# Patient Record
Sex: Male | Born: 1960 | Race: White | Hispanic: No
Health system: Midwestern US, Community
[De-identification: ages and names within clinical notes are randomized; demographics above are authoritative.]

## PROBLEM LIST (undated history)

## (undated) DIAGNOSIS — T8484XA Pain due to internal orthopedic prosthetic devices, implants and grafts, initial encounter: Secondary | ICD-10-CM

## (undated) DIAGNOSIS — T7840XA Allergy, unspecified, initial encounter: Secondary | ICD-10-CM

## (undated) DIAGNOSIS — D239 Other benign neoplasm of skin, unspecified: Secondary | ICD-10-CM

## (undated) DIAGNOSIS — E785 Hyperlipidemia, unspecified: Secondary | ICD-10-CM

## (undated) HISTORY — DX: Hyperlipidemia, unspecified: E78.5

## (undated) HISTORY — PX: OTHER SURGICAL HISTORY: SHX169

## (undated) HISTORY — DX: Other benign neoplasm of skin, unspecified: D23.9

## (undated) HISTORY — DX: Allergy, unspecified, initial encounter: T78.40XA

---

## 2001-05-21 ENCOUNTER — Emergency Department (HOSPITAL_COMMUNITY): Admission: EM | Admit: 2001-05-21 | Discharge: 2001-05-21 | Payer: Self-pay | Admitting: Emergency Medicine

## 2001-05-21 ENCOUNTER — Encounter: Payer: Self-pay | Admitting: Emergency Medicine

## 2004-05-21 ENCOUNTER — Ambulatory Visit (HOSPITAL_COMMUNITY): Admission: RE | Admit: 2004-05-21 | Discharge: 2004-05-21 | Payer: Self-pay | Admitting: Orthopedic Surgery

## 2004-05-21 ENCOUNTER — Ambulatory Visit (HOSPITAL_BASED_OUTPATIENT_CLINIC_OR_DEPARTMENT_OTHER): Admission: RE | Admit: 2004-05-21 | Discharge: 2004-05-21 | Payer: Self-pay | Admitting: Orthopedic Surgery

## 2005-07-03 ENCOUNTER — Ambulatory Visit: Payer: Self-pay | Admitting: Internal Medicine

## 2005-07-23 ENCOUNTER — Ambulatory Visit: Payer: Self-pay | Admitting: Internal Medicine

## 2005-07-31 ENCOUNTER — Ambulatory Visit: Payer: Self-pay | Admitting: Internal Medicine

## 2005-08-07 ENCOUNTER — Ambulatory Visit: Payer: Self-pay | Admitting: Internal Medicine

## 2006-01-04 ENCOUNTER — Ambulatory Visit: Payer: Self-pay | Admitting: Internal Medicine

## 2006-07-26 ENCOUNTER — Ambulatory Visit: Payer: Self-pay | Admitting: Internal Medicine

## 2006-07-26 LAB — CONVERTED CEMR LAB
Cholesterol: 143 mg/dL (ref 0–200)
HDL: 43.1 mg/dL (ref >39.0)
LDL Cholesterol: 90 mg/dL (ref 0–99)
Total CHOL/HDL Ratio: 3.3
Triglycerides: 48 mg/dL (ref 0–149)
VLDL: 10 mg/dL (ref 0–40)

## 2006-08-02 ENCOUNTER — Ambulatory Visit: Payer: Self-pay | Admitting: Internal Medicine

## 2006-09-03 ENCOUNTER — Ambulatory Visit: Payer: Self-pay | Admitting: Internal Medicine

## 2006-09-03 LAB — CONVERTED CEMR LAB: PSA: 0.9 ng/mL (ref 0.10–4.00)

## 2007-05-30 ENCOUNTER — Ambulatory Visit: Payer: Self-pay | Admitting: Internal Medicine

## 2007-05-30 LAB — CONVERTED CEMR LAB
ALT: 21 units/L (ref 0–53)
AST: 20 units/L (ref 0–37)
Albumin: 4.1 g/dL (ref 3.5–5.2)
Alkaline Phosphatase: 61 units/L (ref 39–117)
BUN: 14 mg/dL (ref 6–23)
Basophils Absolute: 0.1 10*3/uL (ref 0.0–0.1)
Basophils Relative: 1.1 % — ABNORMAL HIGH (ref 0.0–1.0)
Bilirubin Urine: NEGATIVE
Bilirubin, Direct: 0.2 mg/dL (ref 0.0–0.3)
Blood in Urine, dipstick: NEGATIVE
CO2: 31 meq/L (ref 19–32)
Calcium: 9.3 mg/dL (ref 8.4–10.5)
Chloride: 104 meq/L (ref 96–112)
Creatinine, Ser: 1.1 mg/dL (ref 0.4–1.5)
Eosinophils Absolute: 0.3 10*3/uL (ref 0.0–0.6)
Eosinophils Relative: 5.6 % — ABNORMAL HIGH (ref 0.0–5.0)
GFR calc Af Amer: 92 mL/min
GFR calc non Af Amer: 76 mL/min
Glucose, Bld: 90 mg/dL (ref 70–99)
Glucose, Urine, Semiquant: NEGATIVE
HCT: 34.7 % — ABNORMAL LOW (ref 39.0–52.0)
Hemoglobin: 12.3 g/dL — ABNORMAL LOW (ref 13.0–17.0)
Ketones, urine, test strip: NEGATIVE
Lymphocytes Relative: 30.2 % (ref 12.0–46.0)
MCHC: 35.5 g/dL (ref 30.0–36.0)
MCV: 90.2 fL (ref 78.0–100.0)
Monocytes Absolute: 0.4 10*3/uL (ref 0.2–0.7)
Monocytes Relative: 8.1 % (ref 3.0–11.0)
Neutro Abs: 2.7 10*3/uL (ref 1.4–7.7)
Neutrophils Relative %: 55 % (ref 43.0–77.0)
Nitrite: NEGATIVE
Platelets: 209 10*3/uL (ref 150–400)
Potassium: 4.4 meq/L (ref 3.5–5.1)
RBC: 3.85 M/uL — ABNORMAL LOW (ref 4.22–5.81)
RDW: 11.7 % (ref 11.5–14.6)
Sodium: 140 meq/L (ref 135–145)
Specific Gravity, Urine: 1.02
TSH: 1.31 microintl units/mL (ref 0.35–5.50)
Total Bilirubin: 1 mg/dL (ref 0.3–1.2)
Total Protein: 6 g/dL (ref 6.0–8.3)
Urobilinogen, UA: 0.2
WBC Urine, dipstick: NEGATIVE
WBC: 5 10*3/uL (ref 4.5–10.5)
pH: 6

## 2007-06-06 ENCOUNTER — Ambulatory Visit: Payer: Self-pay | Admitting: Internal Medicine

## 2007-06-06 DIAGNOSIS — J309 Allergic rhinitis, unspecified: Secondary | ICD-10-CM | POA: Insufficient documentation

## 2007-10-12 ENCOUNTER — Ambulatory Visit: Payer: Self-pay | Admitting: Internal Medicine

## 2009-01-01 ENCOUNTER — Telehealth: Payer: Self-pay | Admitting: Internal Medicine

## 2009-01-02 ENCOUNTER — Ambulatory Visit: Payer: Self-pay | Admitting: Internal Medicine

## 2009-01-03 ENCOUNTER — Ambulatory Visit: Payer: Self-pay | Admitting: Internal Medicine

## 2010-01-25 IMAGING — CR DG CHEST 2V
3 series · 3 of 3 positions shown · non-contrast
Comparison: None.

CLINICAL DATA: Shortness of breath over the past week for weeks.

CHEST - 2 VIEW 01/02/2009:

[view not recorded (1 of 3)]
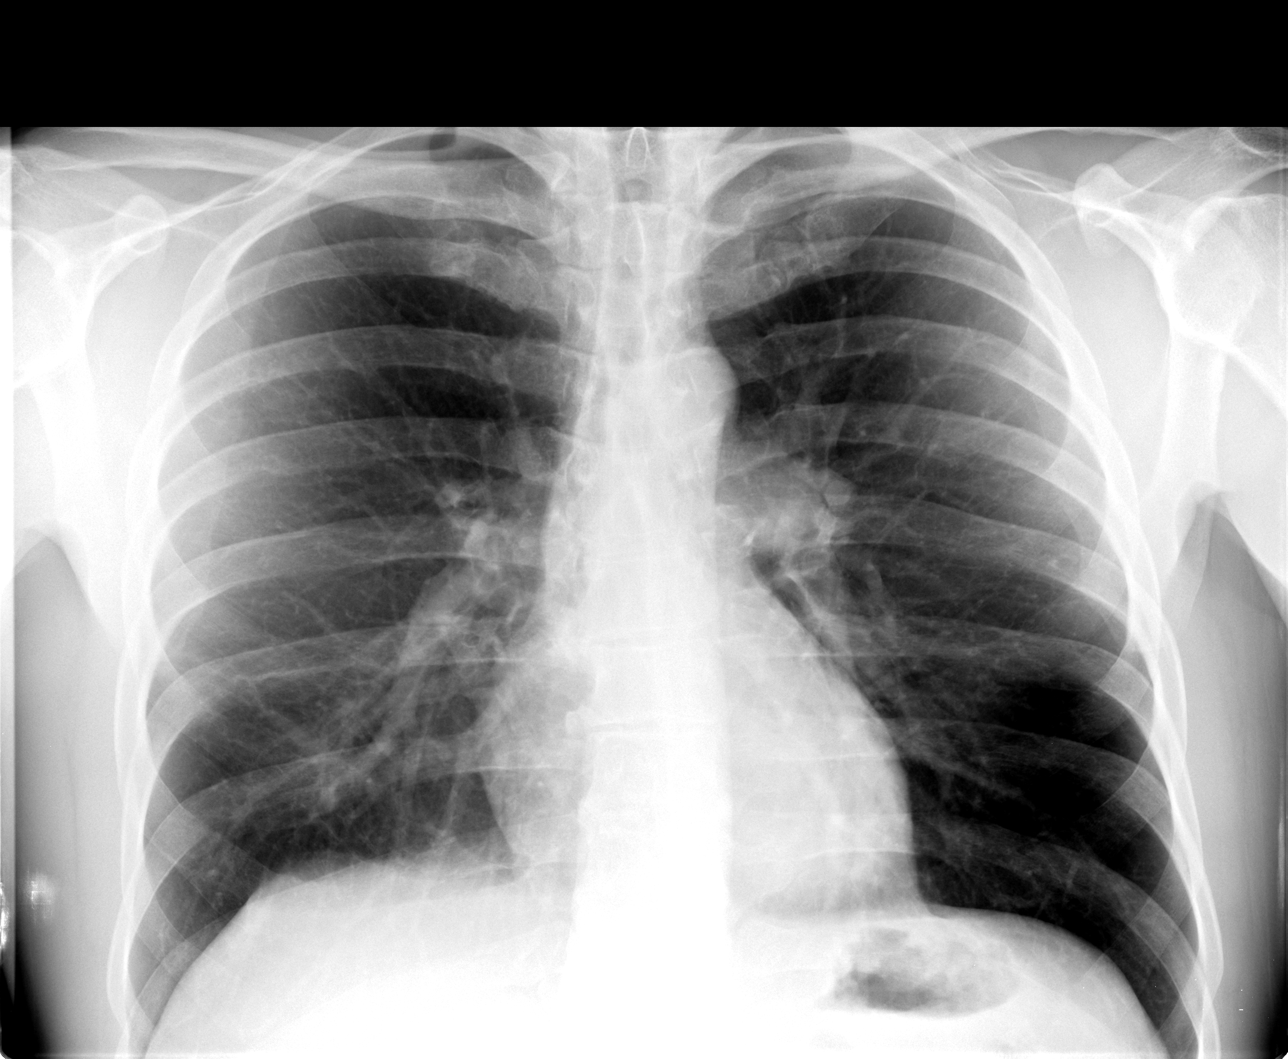

[view not recorded (2 of 3)]
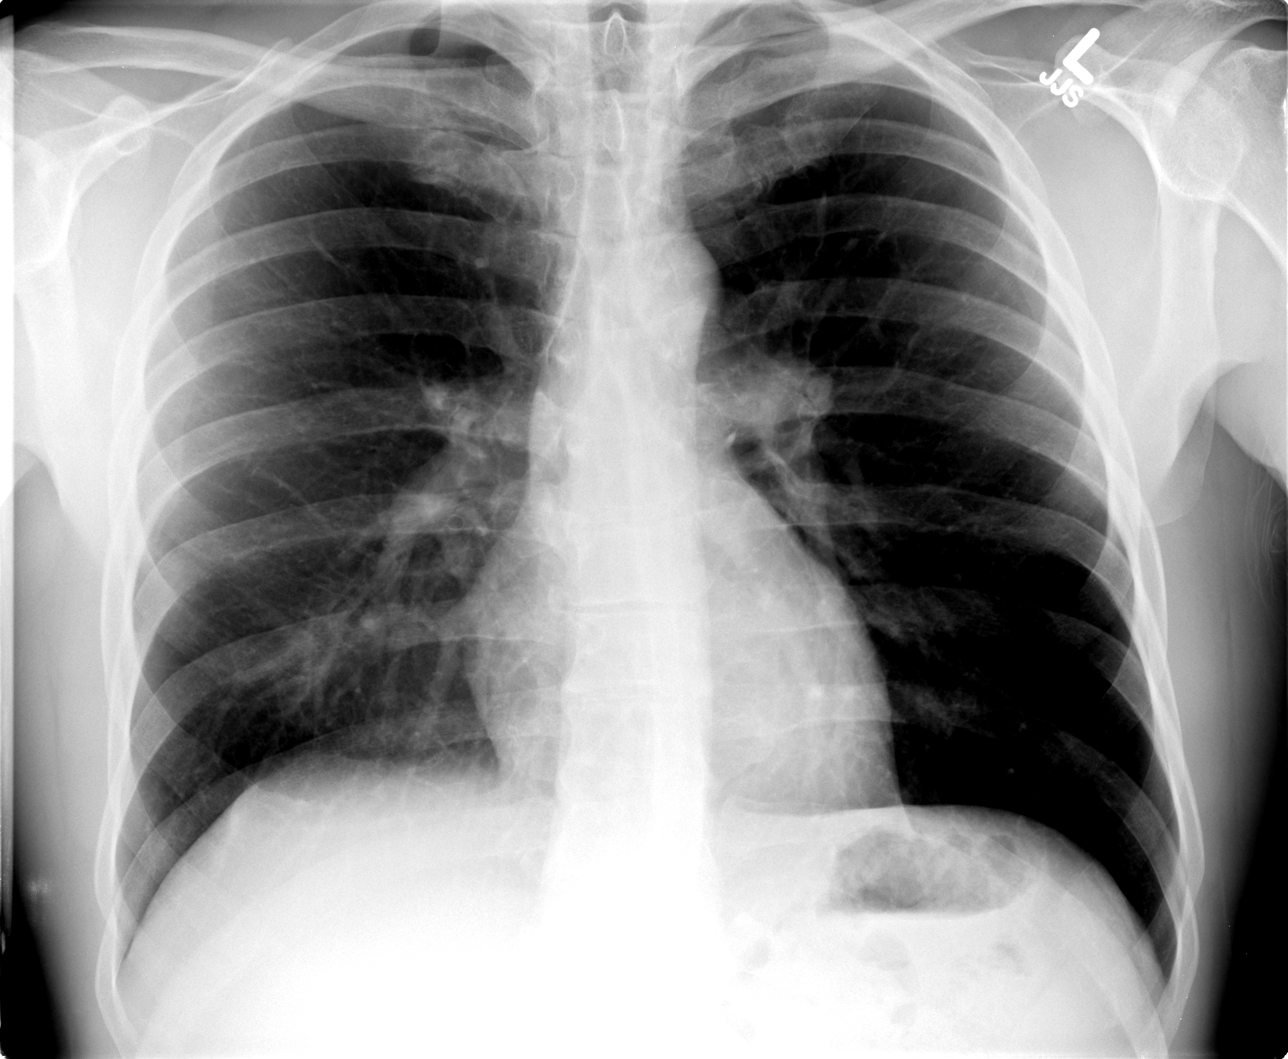

[view not recorded (3 of 3)]
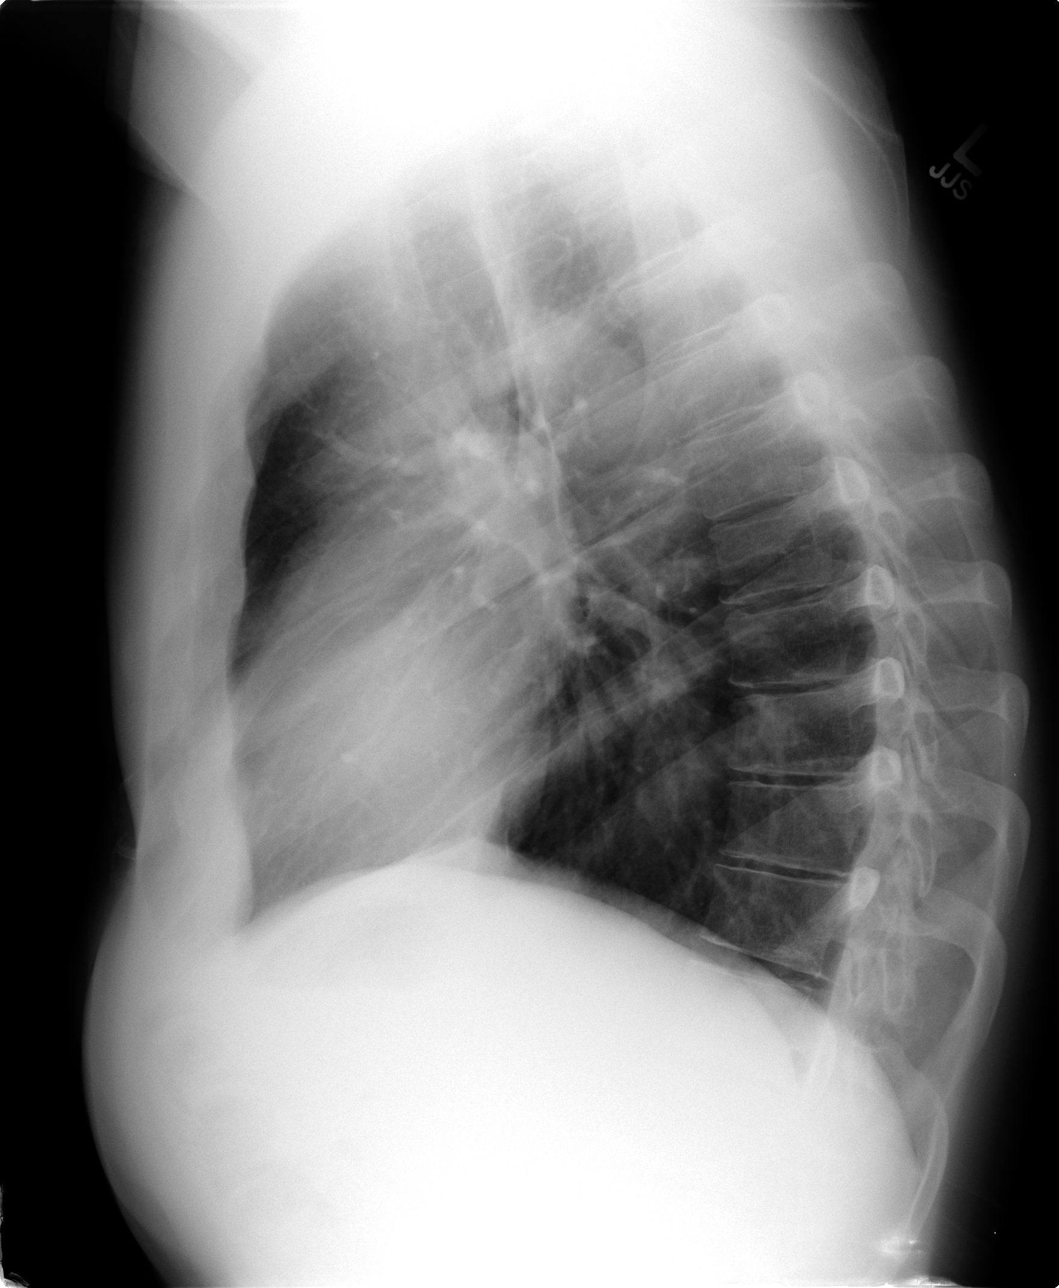

[3 of 3 positions shown; findings below may reference images not displayed]

FINDINGS: Heart size normal.  Normal appearing thoracic aorta.
Prominent left hilum.  Heart right hilum and remaining mediastinal
contours normal.  Asymmetric pulmonary vascularity, with diminished
vascularity in the left lung base relative to the right.  Lungs
clear.  No pleural effusions.  Visualized bony thorax intact.
IMPRESSION: 1.  Prominent left hilum.  While this may be related to an enlarged
pulmonary artery, underlying mass/lymphadenopathy is not entirely
excluded.
2.  Asymmetry in pulmonary vascularity of the lung bases,
diminished on the left. Query mild changes of  Wisam
syndrome.

Because of the above findings, and because of the lack of prior
chest x-rays for comparison, CT chest with contrast would be
suggested in further evaluation.

## 2010-01-26 IMAGING — CT CT CHEST W/ CM
2 of 4 series · 15 of 36 positions shown, 18 images · IV contrast (Omnipaque 300)
Comparison: Chest radiograph 01/02/2009.

CLINICAL DATA: Abnormal chest x-ray 01/02/2009.  Prominent left
hilum.  Shortness of breath.

CT CHEST WITH CONTRAST
TECHNIQUE: Multidetector CT imaging of the chest was performed
following the standard protocol during bolus administration of
intravenous contrast.
Contrast: 80 ml Omnipaque 300

[Series 2: chest routine with · axial · 0.81mm/px · z∈[-351,-76]mm · 12 of 67 slices shown, 15 images]
[im 6/67  mediastinal]
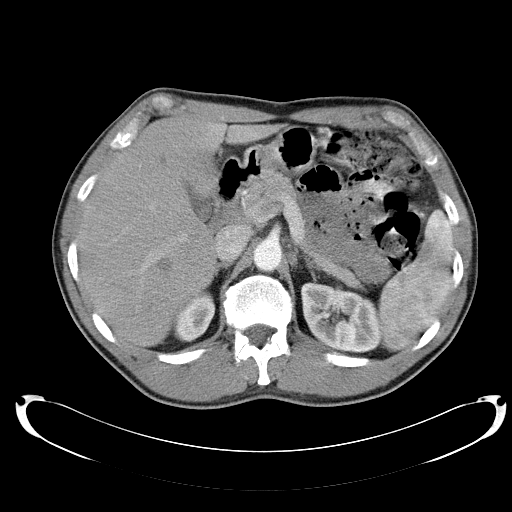
[im 6/67  lung]
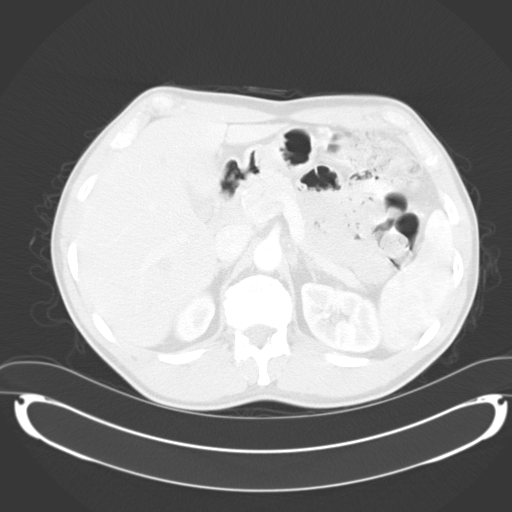
[im 11/67  lung]
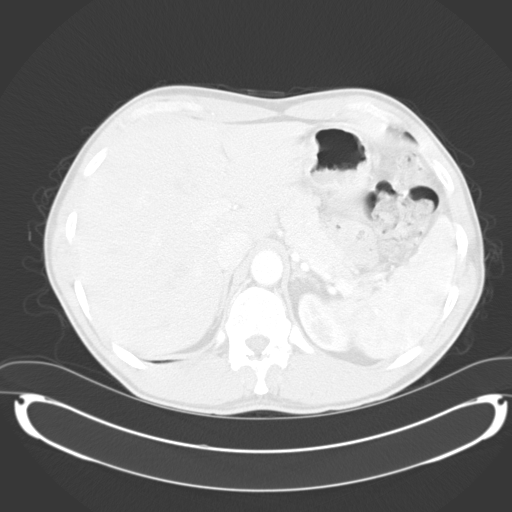
[im 16/67  lung]
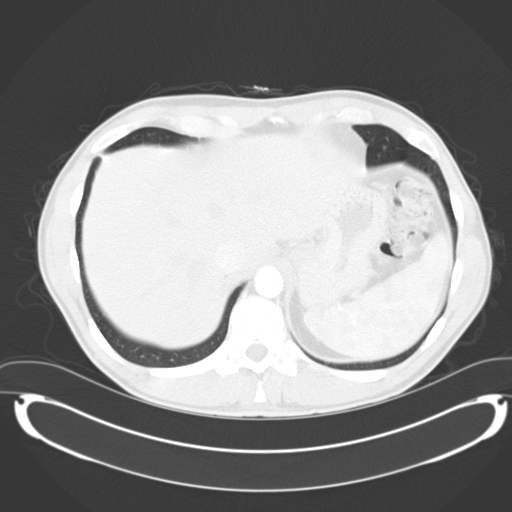
[im 21/67  lung]
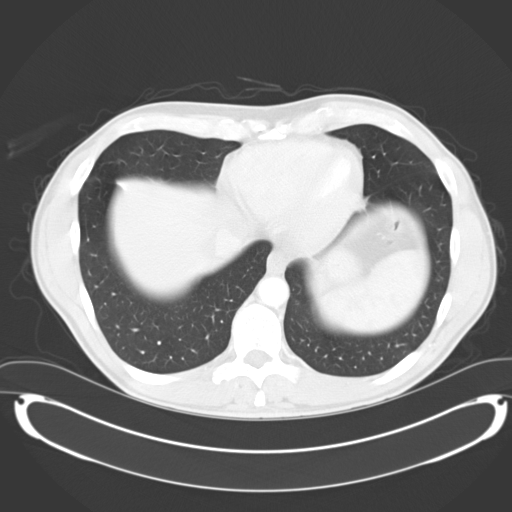
[im 26/67  mediastinal]
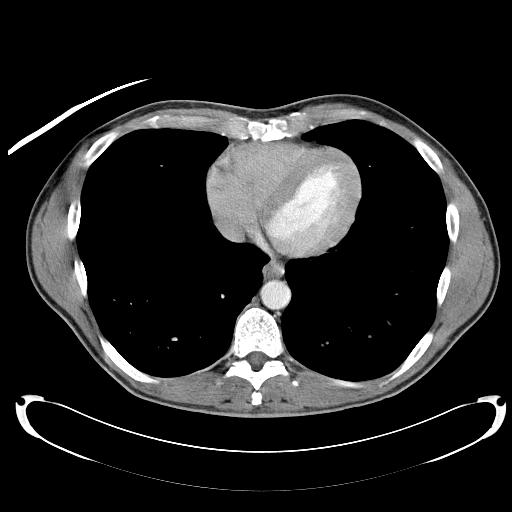
[im 26/67  lung]
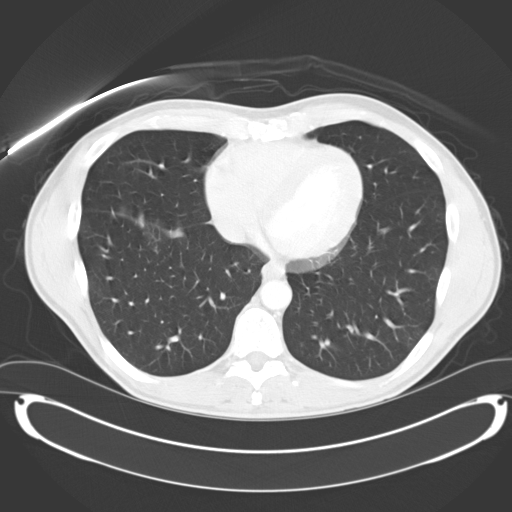
[im 31/67  lung]
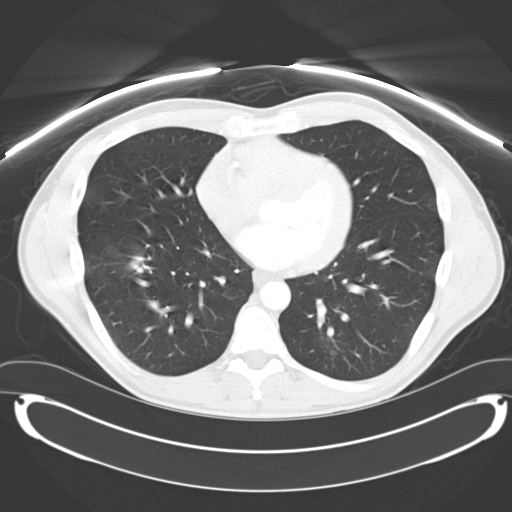
[im 36/67  lung]
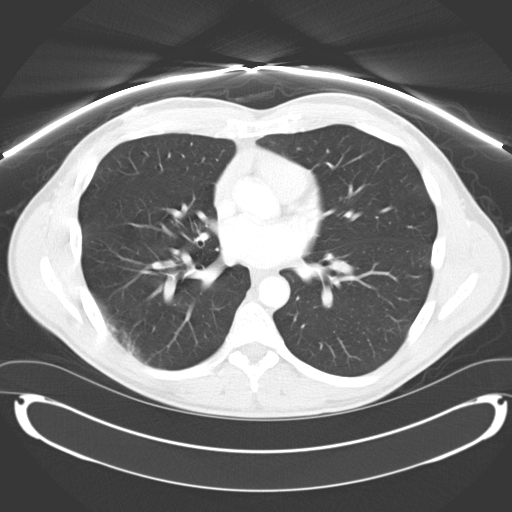
[im 41/67  lung]
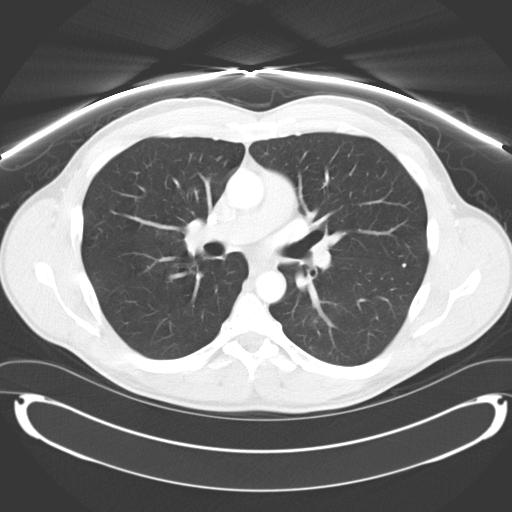
[im 46/67  mediastinal]
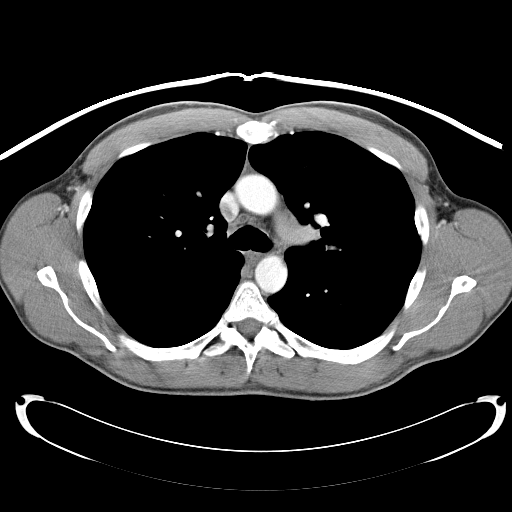
[im 46/67  lung]
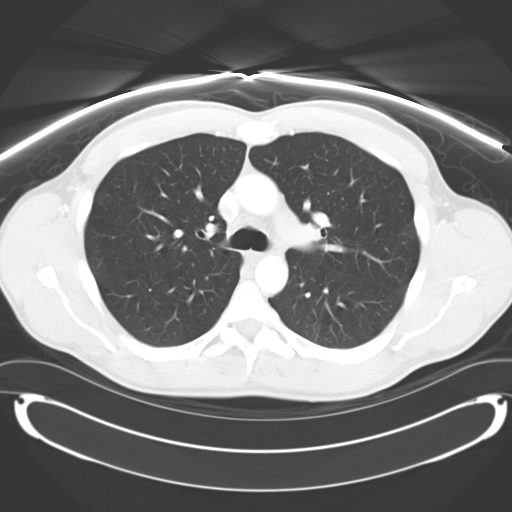
[im 51/67  lung]
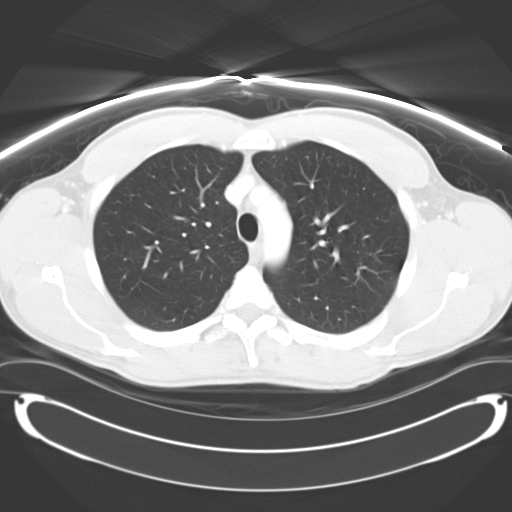
[im 56/67  lung]
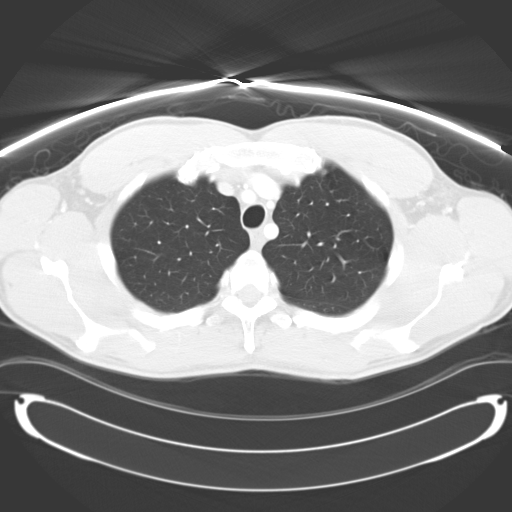
[im 61/67  lung]
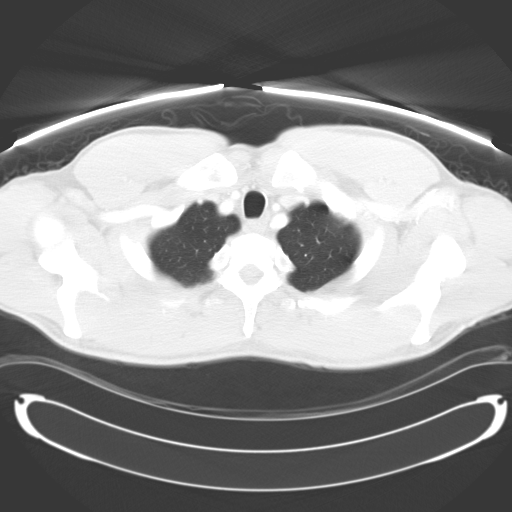

[Series 602: <mpr range> · coronal · 0.81mm/px · 3 of 134 slices shown]
[im 27/134  lung]
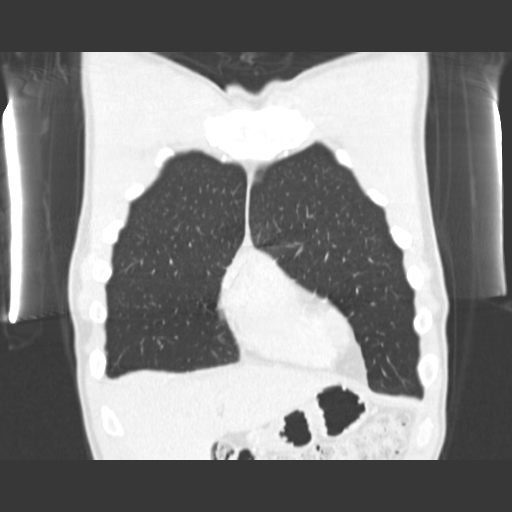
[im 54/134  lung]
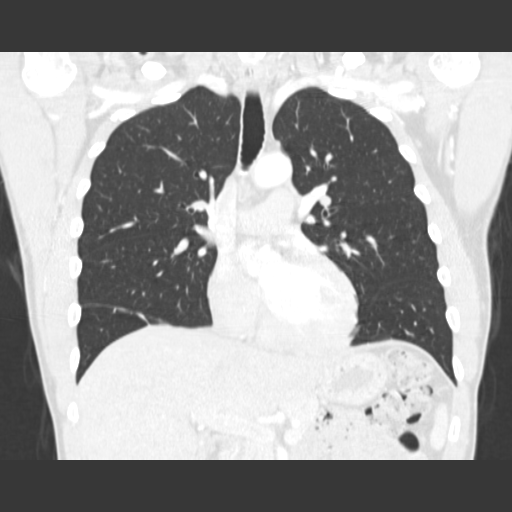
[im 80/134  lung]
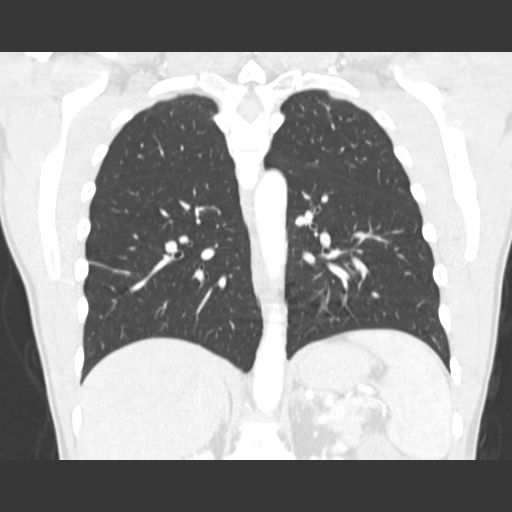

[15 of 36 positions shown; findings below may reference images not displayed]

FINDINGS: No significant mediastinal or axillary adenopathy is
present.  There is no significant hilar adenopathy or hilar mass.
The position of the left pulmonary artery is slightly higher than
usual, likely normal variant.  This likely accounts for the chest
radiograph finding.  Heart size is normal.  There is no significant
pleural or pericardial effusion.  Limited imaging of the upper
abdomen is unremarkable.

There is minimal atelectasis along the right major fissure.  A 3 mm
calcified nodule is seen posteriorly in the left upper lobe on
image #27 of series 3.  There is minimal ill-defined airspace
disease anteriorly within the lingula on image number 30.  A
similar area of minimal ground-glass attenuation is seen
posteriorly in the left lower lobe on image number 35.

Bone windows demonstrate Schmorl's nodes in the lower thoracic
spine.  No other focal lytic or blastic lesions are evident.
IMPRESSION: 1.  No significant hilar or perihilar mastoid account for the chest
radiograph finding.  This likely represents slight elevation of the
left pulmonary artery, a normal variant.
2.  At least two areas of focal ground-glass attenuation in the
left lung associated with a bronchus.  These may represent areas of
early infection or atypical infection.
3.  3 mm calcified granuloma of the left upper lobe.

## 2010-02-25 ENCOUNTER — Ambulatory Visit: Payer: Self-pay | Admitting: Internal Medicine

## 2010-02-25 LAB — CONVERTED CEMR LAB
ALT: 17 units/L (ref 0–53)
AST: 20 units/L (ref 0–37)
Albumin: 4.3 g/dL (ref 3.5–5.2)
Alkaline Phosphatase: 63 units/L (ref 39–117)
BUN: 19 mg/dL (ref 6–23)
Basophils Absolute: 0.1 10*3/uL (ref 0.0–0.1)
Basophils Relative: 1.3 % (ref 0.0–3.0)
Bilirubin Urine: NEGATIVE
Bilirubin, Direct: 0.1 mg/dL (ref 0.0–0.3)
CO2: 29 meq/L (ref 19–32)
Calcium: 9.1 mg/dL (ref 8.4–10.5)
Chloride: 105 meq/L (ref 96–112)
Cholesterol, target level: 200 mg/dL
Cholesterol: 176 mg/dL (ref 0–200)
Creatinine, Ser: 1.1 mg/dL (ref 0.4–1.5)
Eosinophils Absolute: 0.3 10*3/uL (ref 0.0–0.7)
Eosinophils Relative: 5.8 % — ABNORMAL HIGH (ref 0.0–5.0)
GFR calc non Af Amer: 75.13 mL/min (ref >60)
Glucose, Bld: 87 mg/dL (ref 70–99)
HCT: 39.2 % (ref 39.0–52.0)
HDL goal, serum: 40 mg/dL
HDL: 57.4 mg/dL (ref >39.00)
Hemoglobin, Urine: NEGATIVE
Hemoglobin: 13.8 g/dL (ref 13.0–17.0)
Ketones, ur: NEGATIVE mg/dL
LDL Cholesterol: 105 mg/dL — ABNORMAL HIGH (ref 0–99)
LDL Goal: 160 mg/dL
Leukocytes, UA: NEGATIVE
Lymphocytes Relative: 30.2 % (ref 12.0–46.0)
Lymphs Abs: 1.5 10*3/uL (ref 0.7–4.0)
MCHC: 35.2 g/dL (ref 30.0–36.0)
MCV: 92.1 fL (ref 78.0–100.0)
Monocytes Absolute: 0.4 10*3/uL (ref 0.1–1.0)
Monocytes Relative: 7.6 % (ref 3.0–12.0)
Neutro Abs: 2.7 10*3/uL (ref 1.4–7.7)
Neutrophils Relative %: 55.1 % (ref 43.0–77.0)
Nitrite: NEGATIVE
PSA: 0.99 ng/mL (ref 0.10–4.00)
Platelets: 207 10*3/uL (ref 150.0–400.0)
Potassium: 4.6 meq/L (ref 3.5–5.1)
RBC: 4.26 M/uL (ref 4.22–5.81)
RDW: 12.3 % (ref 11.5–14.6)
Sodium: 140 meq/L (ref 135–145)
Specific Gravity, Urine: 1.025 (ref 1.000–1.030)
TSH: 0.95 microintl units/mL (ref 0.35–5.50)
Total Bilirubin: 0.9 mg/dL (ref 0.3–1.2)
Total CHOL/HDL Ratio: 3
Total Protein, Urine: NEGATIVE mg/dL
Total Protein: 6.4 g/dL (ref 6.0–8.3)
Triglycerides: 70 mg/dL (ref 0.0–149.0)
Urine Glucose: NEGATIVE mg/dL
Urobilinogen, UA: 0.2 (ref 0.0–1.0)
VLDL: 14 mg/dL (ref 0.0–40.0)
WBC: 4.9 10*3/uL (ref 4.5–10.5)
pH: 6 (ref 5.0–8.0)

## 2010-05-08 ENCOUNTER — Encounter: Payer: Self-pay | Admitting: Gastroenterology

## 2010-06-05 ENCOUNTER — Encounter (INDEPENDENT_AMBULATORY_CARE_PROVIDER_SITE_OTHER): Payer: Self-pay | Admitting: *Deleted

## 2010-06-06 ENCOUNTER — Ambulatory Visit
Admission: RE | Admit: 2010-06-06 | Discharge: 2010-06-06 | Payer: Self-pay | Source: Home / Self Care | Attending: Gastroenterology | Admitting: Gastroenterology

## 2010-06-16 ENCOUNTER — Ambulatory Visit
Admission: RE | Admit: 2010-06-16 | Discharge: 2010-06-16 | Payer: Self-pay | Source: Home / Self Care | Attending: Gastroenterology | Admitting: Gastroenterology

## 2010-06-16 ENCOUNTER — Encounter: Payer: Self-pay | Admitting: Gastroenterology

## 2010-06-16 HISTORY — PX: COLONOSCOPY: SHX174

## 2010-06-22 LAB — CONVERTED CEMR LAB
Cholesterol: 172 mg/dL (ref 0–200)
HDL: 49 mg/dL (ref >39.0)
LDL Cholesterol: 110 mg/dL — ABNORMAL HIGH (ref 0–99)
PSA: 1.01 ng/mL (ref 0.10–4.00)
Total CHOL/HDL Ratio: 3.5
Triglycerides: 63 mg/dL (ref 0–149)
VLDL: 13 mg/dL (ref 0–40)

## 2010-06-24 NOTE — Assessment & Plan Note (Signed)
Summary: CPX//SLM   Vital Signs:  Patient profile:   50 year old male Height:      63 inches Weight:      196 pounds BMI:     34.85 Temp:     98.2 degrees F oral Pulse rate:   72 / minute Resp:     14 per minute BP sitting:   126 / 80  (left arm)  Vitals Entered By: Willy Eddy, LPN (February 25, 2010 3:28 PM)  Nutrition Counseling: Patient's BMI is greater than 25 and therefore counseled on weight management options. CC: cpx--needs colonoscopy- order in, Lipid Management Is Patient Diabetic? No   CC:  cpx--needs colonoscopy- order in and Lipid Management.  History of Present Illness: The pt was asked about all immunizations, health maint. services that are appropriate to their age and was given guidance on diet exercize  and weight management needs colon and this is a yearly event  Lipid Management History:      Positive NCEP/ATP III risk factors include male age 7 years old or older.  Negative NCEP/ATP III risk factors include no family history for ischemic heart disease and non-tobacco-user status.     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 One By Mouth Daily 2)  Multivitamins   Tabs (Multiple Vitamin) .... Once Daily 3)  Allerx  Dose Pack   Misc (Pse-Methscop & Cpm-Pe-Methscop) .... Take Two Times A Day As Directed 4)  Avelox 400 Mg Tabs (Moxifloxacin Hcl) .Marland Kitchen.. 1 Once Daily For 10 Days  Current Medications (verified): 1)  None  Allergies (verified): No Known Drug Allergies  Past History:  Family History: Last updated: 06/06/2007 father    alive and well mother    hypothyroidism  Social History: Last updated: 06/06/2007 Married Never Smoked Alcohol use-no Drug use-no Regular exercise-yes  Risk Factors: Exercise: yes (06/06/2007)  Risk Factors: Smoking Status: never (02/25/2010) Passive Smoke Exposure: no (02/25/2010)  Past medical, surgical, family and social histories (including risk factors) reviewed, and no changes noted (except as noted below).  Past Medical History: Reviewed history from 06/06/2007 and no changes required. dematofibroma low hdl or type 4 hyperlipidemia Hyperlipidemia Allergic rhinitis  Past Surgical History: Reviewed history from 06/06/2007 and no changes required. right acl repair  Family History: Reviewed history from 06/06/2007 and no changes required. father    alive and well mother    hypothyroidism  Social History: Reviewed history from 06/06/2007 and no changes required. Married Never Smoked Alcohol use-no Drug use-no Regular exercise-yes  Review of Systems  The patient denies anorexia, fever, weight loss, weight gain, vision loss, decreased hearing, hoarseness, chest pain, syncope, dyspnea on exertion, peripheral edema, prolonged cough, headaches, hemoptysis, abdominal pain, melena, hematochezia, severe indigestion/heartburn, hematuria, incontinence, genital sores, muscle  weakness, suspicious skin lesions, transient blindness, difficulty walking, depression, unusual weight change, abnormal bleeding, enlarged lymph nodes, angioedema, and breast masses.    Physical Exam  General:  Well-developed,well-nourished,in no acute distress; alert,appropriate  and cooperative throughout examination Head:  Normocephalic and atraumatic without obvious abnormalities. No apparent alopecia or balding. Eyes:  No corneal or conjunctival inflammation noted. EOMI. Perrla. Funduscopic exam benign, without hemorrhages, exudates or papilledema. Vision grossly normal. Nose:  mucosal edema, mucosal friability, and active bleeding or clots.   Mouth:  posterior lymphoid hypertrophy and postnasal drip.   Neck:  No deformities, masses, or tenderness noted. Lungs:  Normal respiratory effort, chest expands symmetrically. Lungs are clear to auscultation, no crackles or wheezes. Heart:  Normal rate and regular rhythm. S1 and S2 normal without gallop, murmur, click, rub or other extra sounds. Abdomen:  Bowel sounds positive,abdomen soft and non-tender without masses, organomegaly or hernias noted. Rectal:  No external abnormalities noted. Normal sphincter tone. No rectal masses or tenderness. Genitalia:  Testes bilaterally descended without nodularity, tenderness or masses. No scrotal masses or lesions. No penis lesions or urethral discharge. Prostate:  no gland enlargement and no nodules.   Msk:  No deformity or scoliosis noted of thoracic or lumbar spine.   Extremities:  No clubbing, cyanosis, edema, or deformity noted with normal full range of motion of all joints.   Neurologic:  No cranial nerve deficits noted. Station and gait are normal. Plantar reflexes are down-going bilaterally. DTRs are symmetrical throughout. Sensory, motor and coordinative functions appear intact.   Impression & Recommendations:  Problem # 1:  PHYSICAL EXAMINATION (ICD-V70.0) The pt was asked about all immunizations, health maint. services that are appropriate to their age and was given guidance on diet exercize  and weight management  Orders: Gastroenterology Referral (GI)  Td Booster: Td (05/25/2002)   Flu Vax: Historical (02/22/2010)   Chol: 172 (06/06/2007)   HDL: 49.0 (06/06/2007)    LDL: 110 (06/06/2007)   TG: 63 (06/06/2007) TSH: 1.31 (05/30/2007)   PSA: 1.01 (06/06/2007)  Discussed using sunscreen, use of alcohol, drug use, self testicular exam, routine dental care, routine eye care, routine physical exam, seat belts, multiple vitamins, osteoporosis prevention, adequate calcium intake in diet, and recommendations for immunizations.  Discussed exercise and checking cholesterol.  Discussed gun safety, safe sex, and contraception. Also recommend checking PSA.  Other Orders: Urology Referral (Urology)  Lipid Assessment/Plan:      Based on NCEP/ATP III, the patient's risk factor category is "0-1 risk factors".  The patient's lipid goals are as follows: Total cholesterol goal is 200; LDL cholesterol goal is 160; HDL cholesterol goal is 40; Triglyceride goal is 150.     Immunization History:  Influenza Immunization History:    Influenza:  historical (02/22/2010)   Prevention & Chronic Care Immunizations   Influenza vaccine: Historical  (02/22/2010)    Tetanus booster: 05/25/2002: Td    Pneumococcal vaccine: Not documented    Immunization comments: up to date  Colorectal Screening   Hemoccult: Not documented    Colonoscopy: Not documented  Other Screening   PSA: 1.01  (06/06/2007)   Smoking status: never  (02/25/2010)  Lipids   Total Cholesterol: 172  (06/06/2007)   LDL: 110  (06/06/2007)   LDL Direct: Not documented   HDL: 49.0  (06/06/2007)   Triglycerides: 63  (06/06/2007)

## 2010-06-26 NOTE — Letter (Signed)
Summary: [facility] Instructions  [facility]  [address] [place], [place] [address]   Phone: [phone]  Fax: [phone]       Anthony Black    Jan 06, 1961    MRN: [id number]        Procedure Day /Date:  Monday 06/16/2010     Arrival Time: 9:30 am     Procedure Time: 10:30 am     Location of Procedure:                    _x _  [facility] (4th Floor)                        PREPARATION FOR COLONOSCOPY WITH MOVIPREP   Starting 5 days prior to your procedure Wednesday 1/18 do not eat nuts, seeds, popcorn, corn, beans, peas,  salads, or any raw vegetables.  Do not take any fiber supplements (e.g. Metamucil, Citrucel, and Benefiber).  THE DAY BEFORE YOUR PROCEDURE         DATE: Sunday 1/22  1.  Drink clear liquids the entire day-NO SOLID FOOD  2.  Do not drink anything colored red or purple.  Avoid juices with pulp.  No orange juice.  3.  Drink at least 64 oz. (8 glasses) of fluid/clear liquids during the day to prevent dehydration and help the prep work efficiently.  CLEAR LIQUIDS INCLUDE: Water Jello Ice Popsicles Tea (sugar ok, no milk/cream) Powdered fruit flavored drinks Coffee (sugar ok, no milk/cream) Gatorade Juice: apple, white grape, white cranberry  Lemonade Clear bullion, consomm, broth Carbonated beverages (any kind) Strained chicken noodle soup Hard Candy                             4.  In the morning, mix first dose of MoviPrep solution:    Empty 1 Pouch A and 1 Pouch B into the disposable container    Add lukewarm drinking water to the top line of the container. Mix to dissolve    Refrigerate (mixed solution should be used within 24 hrs)  5.  Begin drinking the prep at 5:00 p.m. The MoviPrep container is divided by 4 marks.   Every 15 minutes drink the solution down to the next mark (approximately 8 oz) until the full liter is complete.   6.  Follow completed prep with 16 oz of clear liquid of your choice (Nothing  red or purple).  Continue to drink clear liquids until bedtime.  7.  Before going to bed, mix second dose of MoviPrep solution:    Empty 1 Pouch A and 1 Pouch B into the disposable container    Add lukewarm drinking water to the top line of the container. Mix to dissolve    Refrigerate  THE DAY OF YOUR PROCEDURE      DATE: Monday 1/23  Beginning at 5:30 a.m. (5 hours before procedure):         1. Every 15 minutes, drink the solution down to the next mark (approx 8 oz) until the full liter is complete.  2. Follow completed prep with 16 oz. of clear liquid of your choice.    3. You may drink clear liquids until 8:30 am (2 HOURS BEFORE PROCEDURE).   MEDICATION INSTRUCTIONS  Unless otherwise instructed, you should take regular prescription medications with a small sip of water   as early as possible the morning of your  procedure.           OTHER INSTRUCTIONS  You will need a responsible adult at least 51 years of age to accompany you and drive you home.   This person must remain in the waiting room during your procedure.  Wear loose fitting clothing that is easily removed.  Leave jewelry and other valuables at home.  However, you may wish to bring a book to read or  an iPod/MP3 player to listen to music as you wait for your procedure to start.  Remove all body piercing jewelry and leave at home.  Total time from sign-in until discharge is approximately 2-3 hours.  You should go home directly after your procedure and rest.  You can resume normal activities the  day after your procedure.  The day of your procedure you should not:   Drive   Make legal decisions   Operate machinery   Drink alcohol   Return to work  You will receive specific instructions about eating, activities and medications before you leave.    The above instructions have been reviewed and explained to me by   Sherren Kerns RN  June 06, 2010 8:27 AM    I fully understand and can  verbalize these instructions _____________________________ Date _________

## 2010-06-26 NOTE — Letter (Signed)
Summary: Pre Visit Letter Revised  [facility]  [address] [place], [place] [address]   Phone: [phone]  Fax: [phone]        05/08/2010 MRN: [id number]  Anthony Black [address] [place], [place]  [address]             Procedure Date:  06-16-10 10:30am           Dr Arlyce Dice   Welcome to the Gastroenterology Division at [facility].    You are scheduled to see a nurse for your pre-procedure visit on 06-06-10 at 8:30am on the 3rd floor at [facility], [address]  We ask that you try to arrive at our office 15 minutes prior to your appointment time to allow for check-in.  Please take a minute to review the attached form.  If you answer "Yes" to one or more of the questions on the first page, we ask that you call the person listed at your earliest opportunity.  If you answer "No" to all of the questions, please complete the rest of the form and bring it to your appointment.    Your nurse visit will consist of discussing your medical and surgical history, your immediate family medical history, and your medications.   If you are unable to list all of your medications on the form, please bring the medication bottles to your appointment and we will list them.  We will need to be aware of both prescribed and over the counter drugs.  We will need to know exact dosage information as well.    Please be prepared to read and sign documents such as consent forms, a financial agreement, and acknowledgement forms.  If necessary, and with your consent, a friend or relative is welcome to sit-in on the nurse visit with you.  Please bring your insurance card so that we may make a copy of it.  If your insurance requires a referral to see a specialist, please bring your referral form from your primary care physician.  No co-pay is required for this nurse visit.     If you cannot keep your appointment, please call [phone] to cancel or reschedule prior to your  appointment date.  This allows [place] the opportunity to schedule an appointment for another patient in need of care.    Thank you for choosing  Gastroenterology for your medical needs.  We appreciate the opportunity to care for you.  Please visit [place] at our website  to learn more about our practice.  Sincerely, The Gastroenterology Division

## 2010-06-26 NOTE — Procedures (Signed)
Summary: Colonoscopy  Patient: Brax Walen Note: All result statuses are Final unless otherwise noted.  Tests: (1) Colonoscopy (COL)   COL Colonoscopy           DONE     [facility]     [address]     [place], [place]  [address]           COLONOSCOPY PROCEDURE REPORT           PATIENT:  Deverick, Pruss  MR#:  [id number]     BIRTHDATE:  02/26/1961, 49 yrs. old  GENDER:  male           ENDOSCOPIST:  Barbette Hair. Arlyce Dice, MD     Referred by:  Stacie Glaze, M.D.           PROCEDURE DATE:  06/16/2010     PROCEDURE:  Diagnostic Colonoscopy     ASA CLASS:  Class I     INDICATIONS:  1) Routine Risk Screening           MEDICATIONS:   Fentanyl 75 mcg IV, Fentanyl 75 mcg IV, Versed 8 mg     IV           DESCRIPTION OF PROCEDURE:   After the risks benefits and     alternatives of the procedure were thoroughly explained, informed     consent was obtained.  Digital rectal exam was performed and     revealed no abnormalities.   The [id number] endoscope was     introduced through the anus and advanced to the cecum, which was     identified by both the appendix and ileocecal valve, without     limitations.  The quality of the prep was excellent, using     MoviPrep.  The instrument was then slowly withdrawn as the colon     was fully examined.     <<PROCEDUREIMAGES>>           FINDINGS:  A normal appearing cecum, ileocecal valve, and     appendiceal orifice were identified. The ascending, hepatic     flexure, transverse, splenic flexure, descending, sigmoid colon,     and rectum appeared unremarkable (see image2, image3, image4,     image5, image8, image10, and image11).   Retroflexed views in the     rectum revealed no abnormalities.    The time to cecum =  8.0     minutes. The scope was then withdrawn (time =  6.75  min) from the     patient and the procedure completed.           COMPLICATIONS:  None           ENDOSCOPIC IMPRESSION:     1) Normal colon  RECOMMENDATIONS:     1) Continue current colorectal screening recommendations for     "routine risk" patients with a repeat colonoscopy in 10 years.           REPEAT EXAM:   10 year(s) Colonoscopy           ______________________________     Barbette Hair. Arlyce Dice, MD           CC:           n.     eSIGNED:   Barbette Hair. Oswell Say at 06/16/2010 11:30 AM           Scheryl Darter, [id number]  Note: An exclamation mark (!) indicates a result that was not dispersed into  the flowsheet. Document Creation Date: 06/16/2010 11:30 AM _______________________________________________________________________  (1) Order result status: Final Collection or observation date-time: 06/16/2010 11:26 Requested date-time:  Receipt date-time:  Reported date-time:  Referring Physician:   Ordering Physician: Melvia Heaps [phone]) Specimen Source:  Source: Launa Grill Order Number: [id number] Lab site:   Appended Document: Colonoscopy    Clinical Lists Changes  Observations: Added new observation of COLONNXTDUE: 05/2020 (06/16/2010 15:10)

## 2010-06-26 NOTE — Miscellaneous (Signed)
Summary: previsit prep/rm  Clinical Lists Changes  Medications: Added new medication of MOVIPREP 100 GM  SOLR (PEG-KCL-NACL-NASULF-NA ASC-C) As per prep instructions. - Signed Rx of MOVIPREP 100 GM  SOLR (PEG-KCL-NACL-NASULF-NA ASC-C) As per prep instructions.;  #1 x 0;  Signed;  Entered by: Sherren Kerns RN;  Authorized by: Louis Meckel MD;  Method used: Electronically to [facility]. [place] [phone]*, [address] [place], [place], [place]  [address], Ph: [phone] or [phone], Fax: [phone] Observations: Added new observation of ALLERGY REV: Done (06/06/2010 8:16)    Prescriptions: MOVIPREP 100 GM  SOLR (PEG-KCL-NACL-NASULF-NA ASC-C) As per prep instructions.  #1 x 0   Entered by:   Sherren Kerns RN   Authorized by:   Louis Meckel MD   Signed by:   Sherren Kerns RN on 06/06/2010   Method used:   Electronically to        [facility]. [address] [phone]* (retail)       [address]       [place], [place]  [address]       Ph: [phone] or [phone]       Fax: [phone]   RxID:   [id number]

## 2010-10-10 NOTE — Op Note (Signed)
NAME:  Anthony Black, Anthony Black                ACCOUNT NO.:  [id number]   MEDICAL RECORD NO.:  [id number]          PATIENT TYPE:  AMB   LOCATION:  DSC                          FACILITY:  [facility]   PHYSICIAN:  Nadara Mustard, MD     DATE OF BIRTH:  May 18, 1961   DATE OF PROCEDURE:  05/21/2004  DATE OF DISCHARGE:                                 OPERATIVE REPORT   PREOPERATIVE DIAGNOSIS:  Anterior cruciate ligament insufficiency, right  knee.   POSTOPERATIVE DIAGNOSES:  1.  Anterior cruciate ligament insufficiency, right knee.  2.  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   PROCEDURE:  1.  Abrasion chondroplasty with micro-refraction of medial femoral condyle,      lateral femoral condyle, patella and trochlea.  2.  Anterior cruciate ligament reconstruction with bone/tendon/bone      allograft.   SURGEON:  Nadara Mustard, M.D.   ANESTHESIA:  General plus a femoral block.   ESTIMATED BLOOD LOSS:  Minimal.   ANTIBIOTICS:  Cefzil 1 g.   TOURNIQUET TIME:  Was 69 minutes at 350 mmHg.   DISPOSITION:  To the PACU in stable condition.   PLAN:  For 23-hour observation.   INDICATIONS FOR PROCEDURE:  The patient is a 50 year old gentleman, active  with sports, who has giving way and instability with soccer.  The patient  has had recurrent effusions and presents at this time for surgical  intervention.  The risks and benefits were discussed, including infection,  neurovascular injury, persistent pain, need for additional surgery, failure  of the graft.  The patient states he understands and wishes to proceed at  this time.   DESCRIPTION OF PROCEDURE:  The patient is brought to the operating room #2  after undergoing a femoral block.  He then underwent a general anesthetic.  After an adequate level of anesthesia was obtained, the patient's right  lower extremity was prepped using DuraPrep and draped into a sterile  field.  The scope was inserted from the inferolateral portal, and an inferomedial  portal was established for a working portal.  Visualization of the medial  joint line showed a large osteochondral defect of the medial femoral  condyle.  This was debrided back to bleeding viable subchondral bone.  The  meniscus was intact.  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  Examination of the lateral joint line in a figure-four position,  also showed a lateral femoral osteochondral defect, and this was also  debrided back to bleeding viable subchondral bone.  The patellar trochlear  _________ also had large osteochondral defects, and these were also  debrided.  Attention was then focused on the ACL.  The ACL stump was debrided.  The  notch was cleansed.  Using the tibial guide pin, a guide pin was placed in  the center of the foot print of the ACL.  This was then drilled with a 10 mm  drill and this left approximately 3 mm from the posterior aspect of the  tunnel.  The 7 mm over-the-top guide was then used for the femoral tunnel.  The guide pin was placed and the 10 mm tunnel was drilled, leaving  approximately 1 mm to 2 mm of posterior wall.  The ACL graft was then  fashioned on the back table with it being 10 mm in diameter and 25 mm in  length.  Three Ethibond sutures were placed proximally and two Ethibond  sutures were placed distally.  The graft was then passed with the two-pin  passer and was secured proximally with a 20 mm x 7 mm Kurosaka screw.  It  was secured distally with a 20 mm x 7 mm Kurosaka screw, with the knee  placed in posterior drawer, with tension on the graft.  The knee was placed  through a full range of motion and full extension without impingement.  The  shaver was placed, and further loose bodies were debrided.  Hemostasis was  obtained with the vapor Mytec.  The tourniquet was deflated after 69  minutes.  The instruments were removed.  The  subcutaneous was closed using  #2-0 Vicryl.  The ports were closed using approximate staples.  The wound  was covered with Adaptic, orthopedic sponges, sterile Webril and a Coban  dressing.  The patient was extubated and taken to the PACU in stable condition.   PLAN:  For a 23-hour observation.      [name]   MVD/MEDQ  D:  05/21/2004  T:  05/21/2004  Job:  [id number]

## 2012-02-11 ENCOUNTER — Other Ambulatory Visit (INDEPENDENT_AMBULATORY_CARE_PROVIDER_SITE_OTHER): Payer: BC Managed Care – PPO

## 2012-02-11 DIAGNOSIS — Z0000 Encounter for general adult medical examination without abnormal findings: Secondary | ICD-10-CM

## 2012-02-11 LAB — CBC WITH DIFFERENTIAL/PLATELET
Eosinophils Relative: 6.5 % — ABNORMAL HIGH (ref 0.0–5.0)
HCT: 40.4 % (ref 39.0–52.0)
Lymphs Abs: 1.4 10*3/uL (ref 0.7–4.0)
MCHC: 33.5 g/dL (ref 30.0–36.0)
MCV: 92 fl (ref 78.0–100.0)
Monocytes Absolute: 0.4 10*3/uL (ref 0.1–1.0)
Platelets: 216 10*3/uL (ref 150.0–400.0)
WBC: 6.4 10*3/uL (ref 4.5–10.5)

## 2012-02-11 LAB — HEPATIC FUNCTION PANEL
Alkaline Phosphatase: 56 U/L (ref 39–117)
Bilirubin, Direct: 0.1 mg/dL (ref 0.0–0.3)
Total Protein: 6.3 g/dL (ref 6.0–8.3)

## 2012-02-11 LAB — POCT URINALYSIS DIPSTICK
Bilirubin, UA: NEGATIVE
Ketones, UA: NEGATIVE
Leukocytes, UA: NEGATIVE

## 2012-02-11 LAB — LIPID PANEL
Cholesterol: 183 mg/dL (ref 0–200)
LDL Cholesterol: 115 mg/dL — ABNORMAL HIGH (ref 0–99)
Total CHOL/HDL Ratio: 3

## 2012-02-11 LAB — BASIC METABOLIC PANEL
BUN: 17 mg/dL (ref 6–23)
CO2: 28 mEq/L (ref 19–32)
Chloride: 105 mEq/L (ref 96–112)
Glucose, Bld: 91 mg/dL (ref 70–99)
Potassium: 4.5 mEq/L (ref 3.5–5.1)

## 2012-02-17 ENCOUNTER — Other Ambulatory Visit: Payer: Self-pay

## 2012-02-24 ENCOUNTER — Ambulatory Visit (INDEPENDENT_AMBULATORY_CARE_PROVIDER_SITE_OTHER): Payer: BC Managed Care – PPO | Admitting: Internal Medicine

## 2012-02-24 ENCOUNTER — Encounter: Payer: Self-pay | Admitting: Internal Medicine

## 2012-02-24 VITALS — BP 140/80 | HR 72 | Temp 98.6°F | Resp 16 | Ht 72.0 in | Wt 200.0 lb

## 2012-02-24 DIAGNOSIS — Z23 Encounter for immunization: Secondary | ICD-10-CM

## 2012-02-24 DIAGNOSIS — R972 Elevated prostate specific antigen [PSA]: Secondary | ICD-10-CM

## 2012-02-24 DIAGNOSIS — Z0000 Encounter for general adult medical examination without abnormal findings: Secondary | ICD-10-CM

## 2012-02-24 DIAGNOSIS — N410 Acute prostatitis: Secondary | ICD-10-CM

## 2012-02-24 MED ORDER — CIPROFLOXACIN HCL 500 MG PO TABS: 500.0000 mg | ORAL_TABLET | Freq: Two times a day (BID) | ORAL | Status: DC

## 2012-02-24 NOTE — Patient Instructions (Addendum)
I will contact you after you get your PSA in about 4-5 weeks he will not need to schedule an office visit unless the PSA has not responded to antibiotics.  Prostatitis The prostate gland is about the size and shape of a walnut. It is located just below your bladder. It produces one of the components of semen, which is made up of sperm and the fluids that help nourish and transport it out from the testicles. Prostatitis is redness, soreness, and swelling (inflammation) of the prostate gland.  There are 3 types of prostatitis:  Acute bacterial prostatitis This is the least common type of prostatitis. It starts quickly and usually leads to a bladder infection. It can occur at any age.  Chronic bacterial prostatitis This is a persistent bacterial infection in the prostate.It usually develops from repeated acute bacterial prostatitis or acute bacterial prostatitis that was not properly treated. It can occur in men of any age but is most common in middle-aged men whose prostate has begun to enlarge.  Chronic prostatitis chronic pelvic pain syndrome This is the most common type of prostatitis. It is inflammation of the prostate gland that is not caused by a bacterial infection. The cause is unknown. CAUSES The cause of acute and chronic bacterial prostatitis is a bacterial infection. The exact cause of chronic prostatitis and chronic pelvic pain syndrome and asymptomatic inflammatory prostatitis is unknown.  SYMPTOMS  Symptoms can vary depending upon the type of prostatitis that exists. There can also be overlap in symptoms. Possible symptoms for each type of prostatitis are listed below. Acute bacterial prostatitis  Painful urination.  Fever or chills.  Muscle or joint pains.  Low back pain.  Low abdominal pain.  Inability to empty bladder completely.  Sudden urge to urinate.  Frequent urination.  Difficulty starting urine stream.  Weak urine stream.  Discharge from the  urethra.  Dribbling after urination.  Rectal pain.  Pain in the testicles, penis, or tip of the penis.  Pain in the space between the anus and scrotum (perineum).  Problems with sexual function.  Painful ejaculation.  Bloody semen. Chronic bacterial prostatitis  The symptoms are similar to those of acute bacterial prostatitis, but they usually are much less severe. Fever, chills, and muscle and joint pain are not associated with chronic bacterial prostatitis. Chronic prostatitis chronic pelvic pain syndrome  Symptoms typically include a dull ache in the scrotum and the perineum. DIAGNOSIS  In order to diagnose prostatitis, your caregiver will ask about your symptoms. If acute or chronic bacterial prostatitis is suspected, a urine sample will be taken and tested (urinalysis). This is to see if there is bacteria in your urine. If the urinalysis result is negative for bacteria, your caregiver may use a finger to feel your prostate (digital rectal exam). This exam helps your caregiver determine if your prostate is swollen and tender. TREATMENT  Treatment for prostatitis depends on the cause. If a bacterial infection is the cause, it can be treated with antibiotic medicine. In cases of chronic bacterial prostatitis, the use of antibiotics for up to 1 month may be necessary. Your caregiver may instruct you to take sitz baths to help relieve pain. A sitz bath is a bath of hot water in which your hips and buttocks are under water. HOME CARE INSTRUCTIONS   Take all medicines as directed by your caregiver.  Take sitz baths as directed by your caregiver. SEEK MEDICAL CARE IF:   Your symptoms get worse, not better.  You have  a fever. SEEK IMMEDIATE MEDICAL CARE IF:   You have chills.  You feel nauseous or vomit.  You feel lightheaded or faint.  You are unable to urinate.  You have blood or blood clots in your urine. Document Released: 05/08/2000 Document Revised: 08/03/2011 Document  Reviewed: 04/13/2011 [facility] Patient Information 2013 [place], [place].

## 2012-02-24 NOTE — Progress Notes (Signed)
  Subjective:    Patient ID: Anthony Black, male    DOB: 11/22/1960, 51 y.o.   MRN: [id number]  HPI    Review of Systems  Constitutional: Negative for fever and fatigue.  HENT: Negative for hearing loss, congestion, neck pain and postnasal drip.   Eyes: Negative for discharge, redness and visual disturbance.  Respiratory: Negative for cough, shortness of breath and wheezing.   Cardiovascular: Negative for leg swelling.  Gastrointestinal: Negative for abdominal pain, constipation and abdominal distention.  Genitourinary: Negative for urgency and frequency.  Musculoskeletal: Negative for joint swelling and arthralgias.  Skin: Negative for color change and rash.  Neurological: Negative for weakness and light-headedness.  Hematological: Negative for adenopathy.  Psychiatric/Behavioral: Negative for behavioral problems.       Objective:   Physical Exam  Nursing note and vitals reviewed. Constitutional: He is oriented to person, place, and time. He appears well-developed and well-nourished.  HENT:  Head: Normocephalic and atraumatic.  Eyes: Conjunctivae normal are normal. Pupils are equal, round, and reactive to light.  Neck: Normal range of motion. Neck supple.  Cardiovascular: Normal rate and regular rhythm.   Pulmonary/Chest: Effort normal and breath sounds normal.  Abdominal: Soft. Bowel sounds are normal.  Genitourinary:       No evidence of hernia normal testicles prostate is +2 and boggy tender to touch especially in the left lobe  Musculoskeletal: Normal range of motion.  Neurological: He is alert and oriented to person, place, and time.  Skin: Skin is warm and dry.  Psychiatric: He has a normal mood and affect. His behavior is normal.          Assessment & Plan:   Patient presents for yearly preventative medicine examination.   all immunizations and health maintenance protocols were reviewed with the patient and they are up to date with these protocols.   screening laboratory values were reviewed with the patient including screening of hyperlipidemia PSA renal function and hepatic function.   There medications past medical history social history problem list and allergies were reviewed in detail.   Goals were established with regard to weight loss exercise diet in compliance with medications  Acute prostatitis with enlargement of the prostate and marked elevation of the PSA which is new we'll treat with an antibiotic for 21 days and repeat the PSA and contact patient with results.  Recommend the patient began yearly physicals

## 2012-03-28 ENCOUNTER — Other Ambulatory Visit: Payer: BC Managed Care – PPO

## 2012-03-28 DIAGNOSIS — R972 Elevated prostate specific antigen [PSA]: Secondary | ICD-10-CM

## 2012-03-28 DIAGNOSIS — N410 Acute prostatitis: Secondary | ICD-10-CM

## 2012-03-29 LAB — PSA, TOTAL AND FREE
PSA, Free: 0.47 ng/mL
PSA: 1.39 ng/mL (ref <=4.00)

## 2012-08-23 ENCOUNTER — Encounter: Payer: Self-pay | Admitting: Family Medicine

## 2012-08-23 ENCOUNTER — Ambulatory Visit (INDEPENDENT_AMBULATORY_CARE_PROVIDER_SITE_OTHER): Payer: BC Managed Care – PPO | Admitting: Family Medicine

## 2012-08-23 VITALS — BP 126/68 | HR 93 | Temp 99.5°F | Wt 197.0 lb

## 2012-08-23 DIAGNOSIS — J309 Allergic rhinitis, unspecified: Secondary | ICD-10-CM

## 2012-08-23 MED ORDER — FLUTICASONE PROPIONATE 50 MCG/ACT NA SUSP: 2.0000 | Freq: Every day | NASAL | Status: DC

## 2012-08-23 NOTE — Patient Instructions (Signed)
INSTRUCTIONS FOR UPPER RESPIRATORY INFECTION:  -plenty of rest and fluids  -nasal saline wash [facility]) 2-3 times daily (use prepackaged nasal saline or bottled/distilled water if making your own)   -clean nose with nasal saline before using the nasal steroid or sinex  -can use sinex or afrin nasal spray for drainage and nasal congestion - but do NOT use longer then 3-4 days  -can use tylenol or ibuprofen as directed for aches and sorethroat  -in the winter time, using a humidifier at night is helpful (please follow cleaning instructions)  -if you are taking a cough medication - use only as directed, may also try a teaspoon of honey to coat the throat and throat lozenges  -for sore throat, salt water gargles can help  -follow up if you have fevers, facial pain, tooth pain, difficulty breathing or are worsening or not getting better in 5-7 days

## 2012-08-23 NOTE — Progress Notes (Signed)
Chief Complaint  Patient presents with  . Nasal Congestion    headache, runny nose, congstion, mucus-yellow brownish color     HPI:  Acute visit for sinus issues: -started: 3-4 days after staying at parent's house -symptoms:lots of nasal congestion, chills, sweats, sinus pain, sore throat, drainage in throat, cough, some sneezing initially, ears feels full -denies:fever, SOB, NVD, tooth pain -has tried: musinex and advil and one allegra -sick contacts: no flu exposure -Hx of: allergic rhinitis - always has trouble with allergies in the spring - ued to get allergy shots   ROS: See pertinent positives and negatives per HPI.  Past Medical History  Diagnosis Date  . Dermatofibroma   . Hyperlipidemia   . Allergy     Family History  Problem Relation Age of Onset  . Hypothyroidism Mother     History   Social History  . Marital Status: Divorced    Spouse Name: N/A    Number of Children: N/A  . Years of Education: N/A   Social History Main Topics  . Smoking status: Never Smoker   . Smokeless tobacco: None  . Alcohol Use: No  . Drug Use: None  . Sexually Active: None   Other Topics Concern  . None   Social History Narrative  . None    Current outpatient prescriptions:fluticasone (FLONASE) 50 MCG/ACT nasal spray, Place 2 sprays into the nose daily., Disp: 16 g, Rfl: 2  EXAM:  Filed Vitals:   08/23/12 1035  BP: 126/68  Pulse: 93  Temp: 99.5 F (37.5 C)    Body mass index is 26.71 kg/(m^2).  GENERAL: vitals reviewed and listed above, alert, oriented, appears well hydrated and in no acute distress  HEENT: atraumatic, conjunttiva clear, no obvious abnormalities on inspection of external nose and ears, normal appearance of ear canals and TMs, clear nasal congestion, pale boggy turbinates, dried blood on nasal septum, mild post oropharyngeal erythema with PND and cobble stoning, no tonsillar edema or exudate, no sinus TTP  NECK: no obvious masses on  inspection  LUNGS: clear to auscultation bilaterally, no wheezes, rales or rhonchi, good air movement  CV: HRRR, no peripheral edema  MS: moves all extremities without noticeable abnormality  PSYCH: pleasant and cooperative, no obvious depression or anxiety  ASSESSMENT AND PLAN:  Discussed the following assessment and plan:  ALLERGIC RHINITIS - Plan: fluticasone (FLONASE) 50 MCG/ACT nasal spray  -advised per below - restarting allegra daily and flonase, nasal saline irrigation, follow up prn -Patient advised to return or notify a doctor immediately if symptoms worsen or persist or new concerns arise.  Patient Instructions  INSTRUCTIONS FOR UPPER RESPIRATORY INFECTION:  -plenty of rest and fluids  -nasal saline wash [facility] Huger [facility]) 2-3 times daily (use prepackaged nasal saline or bottled/distilled water if making your own)   -clean nose with nasal saline before using the nasal steroid or sinex  -can use sinex or afrin nasal spray for drainage and nasal congestion - but do NOT use longer then 3-4 days  -can use tylenol or ibuprofen as directed for aches and sorethroat  -in the winter time, using a humidifier at night is helpful (please follow cleaning instructions)  -if you are taking a cough medication - use only as directed, may also try a teaspoon of honey to coat the throat and throat lozenges  -for sore throat, salt water gargles can help  -follow up if you have fevers, facial pain, tooth pain, difficulty breathing or are worsening or not getting better in  5-7 days      Kriste Basque R.

## 2012-10-24 ENCOUNTER — Ambulatory Visit: Payer: BC Managed Care – PPO | Admitting: Family Medicine

## 2013-02-16 ENCOUNTER — Other Ambulatory Visit (INDEPENDENT_AMBULATORY_CARE_PROVIDER_SITE_OTHER): Payer: BC Managed Care – PPO

## 2013-02-16 DIAGNOSIS — Z0000 Encounter for general adult medical examination without abnormal findings: Secondary | ICD-10-CM

## 2013-02-16 LAB — BASIC METABOLIC PANEL
BUN: 15 mg/dL (ref 6–23)
Chloride: 104 mEq/L (ref 96–112)
Creatinine, Ser: 1.1 mg/dL (ref 0.4–1.5)

## 2013-02-16 LAB — CBC WITH DIFFERENTIAL/PLATELET
Basophils Absolute: 0 10*3/uL (ref 0.0–0.1)
Eosinophils Absolute: 0.3 10*3/uL (ref 0.0–0.7)
Eosinophils Relative: 4.6 % (ref 0.0–5.0)
MCV: 90.1 fl (ref 78.0–100.0)
Monocytes Absolute: 0.3 10*3/uL (ref 0.1–1.0)
Neutrophils Relative %: 61.9 % (ref 43.0–77.0)
Platelets: 210 10*3/uL (ref 150.0–400.0)
WBC: 5.6 10*3/uL (ref 4.5–10.5)

## 2013-02-16 LAB — HEPATIC FUNCTION PANEL
ALT: 17 U/L (ref 0–53)
Bilirubin, Direct: 0.1 mg/dL (ref 0.0–0.3)
Total Bilirubin: 1 mg/dL (ref 0.3–1.2)

## 2013-02-16 LAB — LIPID PANEL
Cholesterol: 166 mg/dL (ref 0–200)
HDL: 56.2 mg/dL (ref >39.00)
Triglycerides: 90 mg/dL (ref 0.0–149.0)

## 2013-02-16 LAB — POCT URINALYSIS DIPSTICK
Bilirubin, UA: NEGATIVE
Glucose, UA: NEGATIVE
Leukocytes, UA: NEGATIVE
Nitrite, UA: NEGATIVE

## 2013-02-16 LAB — PSA: PSA: 0.91 ng/mL (ref 0.10–4.00)

## 2013-02-17 ENCOUNTER — Other Ambulatory Visit: Payer: BC Managed Care – PPO

## 2013-02-24 ENCOUNTER — Encounter: Payer: Self-pay | Admitting: Internal Medicine

## 2013-02-24 ENCOUNTER — Ambulatory Visit (INDEPENDENT_AMBULATORY_CARE_PROVIDER_SITE_OTHER): Payer: BC Managed Care – PPO | Admitting: Internal Medicine

## 2013-02-24 VITALS — BP 120/78 | HR 72 | Temp 98.2°F | Resp 16 | Ht 72.0 in | Wt 198.0 lb

## 2013-02-24 DIAGNOSIS — Z0000 Encounter for general adult medical examination without abnormal findings: Secondary | ICD-10-CM

## 2013-02-24 DIAGNOSIS — B079 Viral wart, unspecified: Secondary | ICD-10-CM

## 2013-02-24 DIAGNOSIS — Z23 Encounter for immunization: Secondary | ICD-10-CM

## 2013-02-24 NOTE — Progress Notes (Signed)
Subjective:    Patient ID: Anthony Black, male    DOB: Apr 27, 1961, 52 y.o.   MRN: [id number]  HPI Patient is a 52 year old male who presents for his annual examination he is doing well he has a history of an elevated PSA in the past his PSA has been normalized otherwise his only medical problem is allergies.  He didn't have any blood prior to his blood work which explains the anemia we discussed the need to take iron prior to and post blood donations.  He has a wart on the side of his left small toe   Review of Systems  Constitutional: Negative for fever and fatigue.  HENT: Negative for hearing loss, congestion, neck pain and postnasal drip.   Eyes: Negative for discharge, redness and visual disturbance.  Respiratory: Negative for cough, shortness of breath and wheezing.   Cardiovascular: Negative for leg swelling.  Gastrointestinal: Negative for abdominal pain, constipation and abdominal distention.  Genitourinary: Negative for urgency and frequency.  Musculoskeletal: Negative for joint swelling and arthralgias.  Skin: Negative for color change and rash.  Neurological: Negative for weakness and light-headedness.  Hematological: Negative for adenopathy.  Psychiatric/Behavioral: Negative for behavioral problems.   Past Medical History  Diagnosis Date  . Dermatofibroma   . Hyperlipidemia   . Allergy     History   Social History  . Marital Status: Divorced    Spouse Name: N/A    Number of Children: N/A  . Years of Education: N/A   Occupational History  . Not on file.   Social History Main Topics  . Smoking status: Never Smoker   . Smokeless tobacco: Not on file  . Alcohol Use: No  . Drug Use: Not on file  . Sexual Activity: Not on file   Other Topics Concern  . Not on file   Social History Narrative  . No narrative on file    Past Surgical History  Procedure Laterality Date  . Rt acl repair      Family History  Problem Relation Age of Onset  .  Hypothyroidism Mother     No Known Allergies  No current outpatient prescriptions on file prior to visit.   No current facility-administered medications on file prior to visit.    BP 120/78  Pulse 72  Temp(Src) 98.2 F (36.8 C)  Resp 16  Ht 6' (1.829 m)  Wt 198 lb (89.812 kg)  BMI 26.85 kg/m2       Objective:   Physical Exam  Nursing note and vitals reviewed. Constitutional: He is oriented to person, place, and time. He appears well-developed and well-nourished.  HENT:  Head: Normocephalic and atraumatic.  Eyes: Conjunctivae are normal. Pupils are equal, round, and reactive to light.  Neck: Normal range of motion. Neck supple.  Cardiovascular: Normal rate and regular rhythm.   Pulmonary/Chest: Effort normal and breath sounds normal.  Abdominal: Soft. Bowel sounds are normal.  Genitourinary: Rectum normal and prostate normal.  Other no enlarged at 45 g  Musculoskeletal: Normal range of motion.  Neurological: He is alert and oriented to person, place, and time.  Skin: Skin is warm and dry.  Wart on left small toe  Psychiatric: He has a normal mood and affect. His behavior is normal.          Assessment & Plan:   Patient presents for yearly preventative medicine examination.   all immunizations and health maintenance protocols were reviewed with the patient and they are up to date with these  protocols.   screening laboratory values were reviewed with the patient including screening of hyperlipidemia PSA renal function and hepatic function.   There medications past medical history social history problem list and allergies were reviewed in detail.   Goals were established with regard to weight loss exercise diet in compliance with medications    Informed consent was obtained in the lesion on the left 5 toewas treated for 60 seconds of liquid nitrogen application the patient tolerated the procedure well as procedural care was discussed with the patient and  instructions should the lesion reappears contact our office immediately

## 2013-02-24 NOTE — Patient Instructions (Signed)
Take iron supplementation after blood donation

## 2016-10-05 ENCOUNTER — Encounter

## 2016-10-09 ENCOUNTER — Inpatient Hospital Stay: Admit: 2016-10-09 | Payer: PRIVATE HEALTH INSURANCE | Attending: Orthopaedic Surgery | Primary: Internal Medicine

## 2016-10-09 DIAGNOSIS — T8484XA Pain due to internal orthopedic prosthetic devices, implants and grafts, initial encounter: Secondary | ICD-10-CM

## 2016-10-09 LAB — CBC WITH AUTOMATED DIFF
ABS. BASOPHILS: 0.1 10*3/uL (ref 0.0–0.2)
ABS. EOSINOPHILS: 0.1 10*3/uL (ref 0.0–0.8)
ABS. IMM. GRANS.: 0 10*3/uL (ref 0.0–0.5)
ABS. LYMPHOCYTES: 2.3 10*3/uL (ref 0.5–4.6)
ABS. MONOCYTES: 0.6 10*3/uL (ref 0.1–1.3)
ABS. NEUTROPHILS: 3 10*3/uL (ref 1.7–8.2)
BASOPHILS: 1 % (ref 0.0–2.0)
EOSINOPHILS: 2 % (ref 0.5–7.8)
HCT: 52.3 % — ABNORMAL HIGH (ref 41.1–50.3)
HGB: 17.6 g/dL — ABNORMAL HIGH (ref 13.6–17.2)
IMMATURE GRANULOCYTES: 0 % (ref 0.0–5.0)
LYMPHOCYTES: 37 % (ref 13–44)
MCH: 29.6 PG (ref 26.1–32.9)
MCHC: 33.7 g/dL (ref 31.4–35.0)
MCV: 87.9 FL (ref 79.6–97.8)
MONOCYTES: 9 % (ref 4.0–12.0)
MPV: 8.4 FL — ABNORMAL LOW (ref 10.8–14.1)
NEUTROPHILS: 51 % (ref 43–78)
PLATELET: 324 10*3/uL (ref 150–450)
RBC: 5.95 M/uL — ABNORMAL HIGH (ref 4.23–5.67)
RDW: 14.6 % (ref 11.9–14.6)
WBC: 6 10*3/uL (ref 4.3–11.1)

## 2016-10-09 LAB — SED RATE, AUTOMATED: Sed rate, automated: 2 mm/hr (ref 0–20)

## 2016-10-09 LAB — C REACTIVE PROTEIN, QT: C-Reactive protein: <0.2 mg/dL (ref 0.0–0.9)

## 2016-10-13 ENCOUNTER — Inpatient Hospital Stay: Admit: 2016-10-13 | Payer: PRIVATE HEALTH INSURANCE | Attending: Orthopaedic Surgery | Primary: Internal Medicine

## 2016-10-13 DIAGNOSIS — T8484XA Pain due to internal orthopedic prosthetic devices, implants and grafts, initial encounter: Secondary | ICD-10-CM

## 2016-10-13 LAB — CELL COUNT, BODY FLUID
FLD LYMPHS: 91 %
FLD MONO/MACROPHAGES: 5 %
FLD NEUTROPHILS: 4 %
FLUID RBC CT.: <10000 /mm3
FLUID WBC COUNT: 177 /mm3

## 2016-10-13 MED ORDER — LIDOCAINE HCL 2 % (20 MG/ML) IJ SOLN
20 mg/mL (2 %) | Freq: Once | INTRAMUSCULAR | Status: AC
Start: 2016-10-13 — End: 2016-10-13
  Administered 2016-10-13: 20:00:00 via INTRADERMAL

## 2016-10-13 MED ORDER — LIDOCAINE (PF) 20 MG/ML (2 %) IJ SOLN
20 mg/mL (2 %) | INTRAMUSCULAR | Status: AC
Start: 2016-10-13 — End: ?

## 2016-10-13 MED FILL — LIDOCAINE (PF) 20 MG/ML (2 %) IJ SOLN: 20 mg/mL (2 %) | INTRAMUSCULAR | Qty: 10

## 2016-10-15 LAB — CULTURE, BODY FLUID W GRAM STAIN
Culture result:: NO GROWTH
GRAM STAIN: 0
GRAM STAIN: NONE SEEN

## 2016-11-06 LAB — CULTURE, ANAEROBIC: Culture result:: NO GROWTH

## 2016-11-13 ENCOUNTER — Inpatient Hospital Stay: Admit: 2016-11-13 | Payer: PRIVATE HEALTH INSURANCE | Attending: Orthopaedic Surgery | Primary: Internal Medicine

## 2016-11-13 DIAGNOSIS — Z01812 Encounter for preprocedural laboratory examination: Secondary | ICD-10-CM

## 2016-11-13 LAB — URINALYSIS W/ RFLX MICROSCOPIC
Bilirubin: NEGATIVE
Blood: NEGATIVE
Glucose: NEGATIVE mg/dL
Ketone: NEGATIVE mg/dL
Leukocyte Esterase: NEGATIVE
Nitrites: NEGATIVE
Protein: NEGATIVE mg/dL
Specific gravity: 1.017 (ref 1.001–1.023)
Urobilinogen: 0.2 EU/dL (ref 0.2–1.0)
pH (UA): 6 (ref 5.0–9.0)

## 2016-11-13 LAB — METABOLIC PANEL, COMPREHENSIVE
A-G Ratio: 1.2 (ref 1.2–3.5)
ALT (SGPT): 25 U/L (ref 12–65)
AST (SGOT): 15 U/L (ref 15–37)
Albumin: 4.1 g/dL (ref 3.5–5.0)
Alk. phosphatase: 66 U/L (ref 50–136)
Anion gap: 12 mmol/L (ref 7–16)
BUN: 4 MG/DL — ABNORMAL LOW (ref 6–23)
Bilirubin, total: 0.5 MG/DL (ref 0.2–1.1)
CO2: 22 mmol/L (ref 21–32)
Calcium: 9 MG/DL (ref 8.3–10.4)
Chloride: 96 mmol/L — ABNORMAL LOW (ref 98–107)
Creatinine: 1 MG/DL (ref 0.8–1.5)
GFR est AA: >60 mL/min/{1.73_m2} (ref >60)
GFR est non-AA: >60 mL/min/{1.73_m2} (ref >60)
Globulin: 3.3 g/dL (ref 2.3–3.5)
Glucose: 95 mg/dL (ref 65–100)
Potassium: 4 mmol/L (ref 3.5–5.1)
Protein, total: 7.4 g/dL (ref 6.3–8.2)
Sodium: 130 mmol/L — ABNORMAL LOW (ref 136–145)

## 2016-11-13 LAB — CBC W/O DIFF
HCT: 44.8 % (ref 41.1–50.3)
HGB: 15.6 g/dL (ref 13.6–17.2)
MCH: 29.8 PG (ref 26.1–32.9)
MCHC: 34.8 g/dL (ref 31.4–35.0)
MCV: 85.7 FL (ref 79.6–97.8)
MPV: 9.4 FL — ABNORMAL LOW (ref 10.8–14.1)
PLATELET: 302 10*3/uL (ref 150–450)
RBC: 5.23 M/uL (ref 4.23–5.67)
RDW: 13.1 % (ref 11.9–14.6)
WBC: 11.9 10*3/uL — ABNORMAL HIGH (ref 4.3–11.1)

## 2016-11-13 LAB — PROTHROMBIN TIME + INR
INR: 0.9
Prothrombin time: 12.4 s (ref 11.5–14.5)

## 2016-11-13 LAB — PTT: aPTT: 32.5 s (ref 23.2–35.3)

## 2016-11-13 LAB — MAGNESIUM: Magnesium: 1.8 mg/dL (ref 1.8–2.4)

## 2016-11-13 NOTE — Other (Addendum)
CBC, CMP, U/A, PT/PTT, Magnesium; results within [facility] protocols.       [facility] 11.9 [facility]. Jadene PieriniPosta's office per anesthesia protocols.

## 2016-11-13 NOTE — Other (Signed)
Dr. Quincy Carnes,     Your patient was seen in [place] today. I am attaching the results of the labs for your to review and follow-up with if necessary. If you would like to order additional labs or tests please enter into [facility] or fax to [phone]. Please do not respond to this message as my mailbox is not monitored. You may call [phone] with questions or concerns. Please note WBC count of 11.9.     Recent Results (from the past 12 hour(s))   CBC W/O DIFF    Collection Time: 11/13/16  2:11 PM   Result Value Ref Range    WBC 11.9 (H) 4.3 - 11.1 K/uL    RBC 5.23 4.23 - 5.67 M/uL    HGB 15.6 13.6 - 17.2 g/dL    HCT 44.8 41.1 - 50.3 %    MCV 85.7 79.6 - 97.8 FL    MCH 29.8 26.1 - 32.9 PG    MCHC 34.8 31.4 - 35.0 g/dL    RDW 13.1 11.9 - 14.6 %    PLATELET 302 150 - 450 K/uL    MPV 9.4 (L) 10.8 - 45.0 FL   METABOLIC PANEL, COMPREHENSIVE    Collection Time: 11/13/16  2:11 PM   Result Value Ref Range    Sodium 130 (L) 136 - 145 mmol/L    Potassium 4.0 3.5 - 5.1 mmol/L    Chloride 96 (L) 98 - 107 mmol/L    CO2 22 21 - 32 mmol/L    Anion gap 12 7 - 16 mmol/L    Glucose 95 65 - 100 mg/dL    BUN 4 (L) 6 - 23 MG/DL    Creatinine 1.00 0.8 - 1.5 MG/DL    GFR est AA >60 >60 ml/min/1.93m    GFR est non-AA >60 >60 ml/min/1.734m   Calcium 9.0 8.3 - 10.4 MG/DL    Bilirubin, total 0.5 0.2 - 1.1 MG/DL    ALT (SGPT) 25 12 - 65 U/L    AST (SGOT) 15 15 - 37 U/L    Alk. phosphatase 66 50 - 136 U/L    Protein, total 7.4 6.3 - 8.2 g/dL    Albumin 4.1 3.5 - 5.0 g/dL    Globulin 3.3 2.3 - 3.5 g/dL    A-G Ratio 1.2 1.2 - 3.5     MAGNESIUM    Collection Time: 11/13/16  2:11 PM   Result Value Ref Range    Magnesium 1.8 1.8 - 2.4 mg/dL   PTT    Collection Time: 11/13/16  2:11 PM   Result Value Ref Range    aPTT 32.5 23.2 - 35.3 SEC   PROTHROMBIN TIME + INR    Collection Time: 11/13/16  2:11 PM   Result Value Ref Range    Prothrombin time 12.4 11.5 - 14.5 sec    INR 0.9     URINALYSIS W/ RFLX MICROSCOPIC     Collection Time: 11/13/16  2:13 PM   Result Value Ref Range    Color YELLOW      Appearance CLOUDY      Specific gravity 1.017 1.001 - 1.023      pH (UA) 6.0 5.0 - 9.0      Protein NEGATIVE  NEG mg/dL    Glucose NEGATIVE  mg/dL    Ketone NEGATIVE  NEG mg/dL    Bilirubin NEGATIVE  NEG      Blood NEGATIVE  NEG      Urobilinogen 0.2 0.2 -  1.0 EU/dL    Nitrites NEGATIVE  NEG      Leukocyte Esterase NEGATIVE  NEG

## 2016-11-13 NOTE — Other (Signed)
Angel Durie, MD    Your patient recently had labs drawn during a hospital appointment due to an upcoming surgery. The results are attached. If you have any questions or concerns please reach out to your patient for a follow-up in your office. Please do not respond to this message as my mailbox is not monitored. You may call [phone] with questions or concerns.    Recent Results (from the past 12 hour(s))   CBC W/O DIFF    Collection Time: 11/13/16  2:11 PM   Result Value Ref Range    WBC 11.9 (H) 4.3 - 11.1 K/uL    RBC 5.23 4.23 - 5.67 M/uL    HGB 15.6 13.6 - 17.2 g/dL    HCT 44.8 41.1 - 50.3 %    MCV 85.7 79.6 - 97.8 FL    MCH 29.8 26.1 - 32.9 PG    MCHC 34.8 31.4 - 35.0 g/dL    RDW 13.1 11.9 - 14.6 %    PLATELET 302 150 - 450 K/uL    MPV 9.4 (L) 10.8 - 34.7 FL   METABOLIC PANEL, COMPREHENSIVE    Collection Time: 11/13/16  2:11 PM   Result Value Ref Range    Sodium 130 (L) 136 - 145 mmol/L    Potassium 4.0 3.5 - 5.1 mmol/L    Chloride 96 (L) 98 - 107 mmol/L    CO2 22 21 - 32 mmol/L    Anion gap 12 7 - 16 mmol/L    Glucose 95 65 - 100 mg/dL    BUN 4 (L) 6 - 23 MG/DL    Creatinine 1.00 0.8 - 1.5 MG/DL    GFR est AA >60 >60 ml/min/1.78m    GFR est non-AA >60 >60 ml/min/1.751m   Calcium 9.0 8.3 - 10.4 MG/DL    Bilirubin, total 0.5 0.2 - 1.1 MG/DL    ALT (SGPT) 25 12 - 65 U/L    AST (SGOT) 15 15 - 37 U/L    Alk. phosphatase 66 50 - 136 U/L    Protein, total 7.4 6.3 - 8.2 g/dL    Albumin 4.1 3.5 - 5.0 g/dL    Globulin 3.3 2.3 - 3.5 g/dL    A-G Ratio 1.2 1.2 - 3.5     MAGNESIUM    Collection Time: 11/13/16  2:11 PM   Result Value Ref Range    Magnesium 1.8 1.8 - 2.4 mg/dL   PTT    Collection Time: 11/13/16  2:11 PM   Result Value Ref Range    aPTT 32.5 23.2 - 35.3 SEC   PROTHROMBIN TIME + INR    Collection Time: 11/13/16  2:11 PM   Result Value Ref Range    Prothrombin time 12.4 11.5 - 14.5 sec    INR 0.9     URINALYSIS W/ RFLX MICROSCOPIC    Collection Time: 11/13/16  2:13 PM   Result Value Ref Range     Color YELLOW      Appearance CLOUDY      Specific gravity 1.017 1.001 - 1.023      pH (UA) 6.0 5.0 - 9.0      Protein NEGATIVE  NEG mg/dL    Glucose NEGATIVE  mg/dL    Ketone NEGATIVE  NEG mg/dL    Bilirubin NEGATIVE  NEG      Blood NEGATIVE  NEG      Urobilinogen 0.2 0.2 - 1.0 EU/dL    Nitrites NEGATIVE  NEG  Leukocyte Esterase NEGATIVE  NEG

## 2016-11-13 NOTE — Undefined (Signed)
Total Joint Surgery Preoperative Chart Review      Patient ID:  Angel Weeks  [id number]  56 y.o.  1961-02-13  Surgeon: Dr. Quincy Carnes  Date of Surgery: 11/20/2016  Procedure: Total Right Shoulder Arthroplasty  Primary Care Physician: Wilhemena Durie, MD [id number]  Specialty Physician(s):      Subjective:   Angel Weeks is a 56 y.o. WHITE OR CAUCASIAN male who presents for preoperative evaluation for Total Right Shoulder arthroplasty.    This is a preoperative chart review note based on data collected by the nurse at the surgical Pre-Assessment visit.    Past Medical History:   Diagnosis Date   ??? Anxiety    ??? Arthritis    ??? Chronic daily headache    ??? Chronic pain     head; right shoulder; lower back    ??? Current smoker     < 0.5 pack/day   ??? Depression    ??? GERD (gastroesophageal reflux disease)     tums prn    ??? Meningioma (Angel Weeks)     removed in 2015; was on the right side of brain stem; benign    ??? Pulmonary nodules     He had his baseline LDCT 03/11/2015 which demonstrated a few small lung nodules, Lung Rads 2. March 09, 2016 L DCT showed no new nodules and stable previously noted less than 6 mm nodules   ??? 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 Sleep apnea     no c-pap   ??? 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 HX OTHER SURGICAL Left 2016    VP shunt placement    ??? HX SHOULDER ARTHROSCOPY Right 10/2015   ??? HX SHOULDER REPLACEMENT Right 10/2014   ??? NEUROLOGICAL PROCEDURE UNLISTED Right 2015    Meningioma removed     Family History   Problem Relation Age of Onset   ??? No Known Problems Mother    ??? Alcohol abuse Father    ??? Diabetes Other       Social History   Substance Use Topics   ??? Smoking status: Current Every Day Smoker     Packs/day: 0.25     Years: 40.00   ??? Smokeless tobacco: Never Used   ??? Alcohol use No       Prior to Admission medications     Medication Sig Start Date End Date Taking? Authorizing Provider   cyclobenzaprine (FLEXERIL) 10 mg tablet Take 10 mg by mouth three (3) times daily as needed. Take / use AM day of surgery  per anesthesia protocols if needed. 09/10/16  Yes Historical Provider   dextroamphetamine-amphetamine (ADDERALL) 20 mg tablet Take 20 mg by mouth every twelve (12) hoursIndications: depression.         11/04/16  Yes Historical Provider   amitriptyline (ELAVIL) 50 mg tablet Take 50 mg by mouth nightly. 09/10/16  Yes Historical Provider   ibuprofen (MOTRIN) 800 mg tablet Take 800 mg by mouth every eight (8) hours as needed. 09/10/16  Yes Historical Provider   montelukast (SINGULAIR) 10 mg tablet Take 10 mg by mouth daily. Take / use AM day of surgery  per anesthesia protocols. 09/10/16  Yes Historical Provider   HYDROcodone-acetaminophen (NORCO) 10-325 mg tablet Take 1 Tab by mouth every four (4) hours as needed for Pain. Take /  use AM day of surgery  per anesthesia protocols if needed.  Indications: Pain   Yes Historical Provider   morphine CR (MS CONTIN) 15 mg CR tablet Take 15 mg by mouth every twelve (12) hours. Take / use AM day of surgery  per anesthesia protocols.  Indications: Chronic Pain   Yes Historical Provider   LORazepam (ATIVAN) 2 mg tablet Take 2 mg by mouth every eight (8) hours as needed for Anxiety. Takes PRN and at bedtime   Take / use AM day of surgery  per anesthesia protocols if needed.   Yes Historical Provider     No Known Allergies       Objective:     Physical Exam:       ECG:    EKG Results     None          Data Review:   Labs:   No results found for this or any previous visit (from the past 24 hour(s)).    Results for CASHIUS, GRANDSTAFF (MRN [id number]) as of 11/17/2016 17:38   Ref. Range 11/13/2016 14:11   WBC Latest Ref Range: 4.3 - 11.1 K/uL 11.9 (H)   RBC Latest Ref Range: 4.23 - 5.67 M/uL 5.23   HGB Latest Ref Range: 13.6 - 17.2 g/dL 15.6   HCT Latest Ref Range: 41.1 - 50.3 % 44.8    MCV Latest Ref Range: 79.6 - 97.8 FL 85.7   MCH Latest Ref Range: 26.1 - 32.9 PG 29.8   MCHC Latest Ref Range: 31.4 - 35.0 g/dL 34.8   RDW Latest Ref Range: 11.9 - 14.6 % 13.1   PLATELET Latest Ref Range: 150 - 450 K/uL 302   MPV Latest Ref Range: 10.8 - 14.1 FL 9.4 (L)   Results for Angel, Weeks (MRN [id number]) as of 11/17/2016 17:38   Ref. Range 11/13/2016 14:11   Sodium Latest Ref Range: 136 - 145 mmol/L 130 (L)   Potassium Latest Ref Range: 3.5 - 5.1 mmol/L 4.0   Chloride Latest Ref Range: 98 - 107 mmol/L 96 (L)   CO2 Latest Ref Range: 21 - 32 mmol/L 22   Anion gap Latest Ref Range: 7 - 16 mmol/L 12   Glucose Latest Ref Range: 65 - 100 mg/dL 95   BUN Latest Ref Range: 6 - 23 MG/DL 4 (L)   Creatinine Latest Ref Range: 0.8 - 1.5 MG/DL 1.00   Calcium Latest Ref Range: 8.3 - 10.4 MG/DL 9.0   Magnesium Latest Ref Range: 1.8 - 2.4 mg/dL 1.8   GFR est non-AA Latest Ref Range: >60 ml/min/1.57m >60   GFR est AA Latest Ref Range: >60 ml/min/1.738m>60   Bilirubin, total Latest Ref Range: 0.2 - 1.1 MG/DL 0.5   Protein, total Latest Ref Range: 6.3 - 8.2 g/dL 7.4   Albumin Latest Ref Range: 3.5 - 5.0 g/dL 4.1   Globulin Latest Ref Range: 2.3 - 3.5 g/dL 3.3   A-G Ratio Latest Ref Range: 1.2 - 3.5   1.2   ALT (SGPT) Latest Ref Range: 12 - 65 U/L 25   AST Latest Ref Range: 15 - 37 U/L 15   Alk. phosphatase Latest Ref Range: 50 - 136 U/L 66     Problem List:  )  Patient Active Problem List   Diagnosis Code   ??? History of right shoulder replacement Z96.611   ??? Inst[place] of [place] shoulder joint ([place]A, Z96.619   ??? Noncompliance with CPAP treatment Z91.14  Total Joint Surgery Pre-Assessment Recommendations:           Dr Quincy Carnes to address abnormal labs.  Recommend continuous saturation monitoring during hospitalization.  PEP Therapy BID  Albuterol every 6 hours as need during hospitalization.         Signed By: Timmothy Euler, NP-C    November 17, 2016

## 2016-11-13 NOTE — Other (Signed)
Patient verified name, DOB, and surgery as listed in [facility].    Type 3 surgery, walk in assessment complete.    Labs per surgeon: CBC, CMP, U/A, PT/PTT, MRSA nasal swab, Magnesium ; results pending. Coming in on 11/19/16 for T&C.   Labs per anesthesia protocol: included in surgeons orders.   EKG: not needed per [facility] protocols.     Most recent neurology office note dated 04/29/16 & pulmonology office note dated 03/23/16 on chart for reference.     Had normal stress and echo in 2017. Records/results available in care everywhere for reference if needed.     Hibiclens and instructions to return bottle on DOS given per hospital policy.    Nasal Swab collected per MD order and instructions for Mupirocin nasal ointment if required.    Patient provided with handouts including Guide to Surgery, Pain Management, Hand Hygiene, Blood Transfusion Education, and [employer] Anesthesia Brochure.    Patient answered medical/surgical history questions at their best of ability. All prior to admission medications documented in [facility]. Original medication prescription bottles not visualized during patient appointment.     Patient instructed to hold all vitamins 7 days prior to surgery and NSAIDS 5 days prior to surgery. Medications to be held: ibuprofen.     Patient instructed to continue previous medications as prescribed prior to surgery and to take the following medications the day of surgery according to anesthesia guidelines with a small sip of water: flexeril if needed, norco if needed, ativan if needed, montelukast, morphine.      Patient teach back successful and patient demonstrates knowledge of instruction.

## 2016-11-14 LAB — MSSA/MRSA SC BY PCR, NASAL SWAB

## 2016-11-14 NOTE — Brief Op Note (Signed)
BRIEF OPERATIVE NOTE    Date of Procedure: 11/20/2016     Preoperative Diagnosis:  PERIPROSTHETIC INSTABILITY S/P RIGHT TSA    Postoperative Diagnosis:  SAME    Procedure(s): 1.  INCISION, DEBRIDEMENT, REMOVAL IMPLANT RIGHT SHOULDER "ZIMMER TM PRESS FIT TSA" WITH PROXIMAL  HUMERAL OSTEOTOMY     2.  REVISION RIGHT TOTAL SHOULDER ARTHROPLASTY WITH REVERSE LONG STEM DELTA EXTEND PROSTHESIS, BICEPS TENODESIS, LATISSIMUS DORSI AND TERES MAJOR TENDON TRANSFERS    Surgeon(s) and Role:     * Orvis BrillAlan G Posta Jr., MD - Primary         Assistant Staff:  Kathline MagicKIM CALDWELL CFA      Surgical Staff:  Circ-1: (Unknown)  Scrub Tech-1: (Unknown)  Scrub Tech-2: (Unknown)  Scrub Tech-3: (Unknown)  No case tracking events are documented in the log.    Anesthesia:  GENERAL WITH INTERSCALENE BLOCK    Estimated Blood Loss: 500 cc.    Specimens:  CULTURES AND FROZEN SECTION    Complications: NONE    Implants:     Implant Name Type Inv. Item Serial No. Manufacturer Lot No. LRB No. Used Action   CEMENT BNE FL 20ML MONMR 40GM -- SIMPLEX P - [id number]  CEMENT BNE FL 20ML MONMR 40GM -- SIMPLEX P [id number] [employer] HOWM [id number] Right 1 Implanted   cementless metaglene 10mm   [id number] [employer] [id number] Right 1 Implanted   COMPNT GLENOSPHERE ECC 42MM -- DELTA EXTEND - [id number]  COMPNT GLENOSPHERE ECC 42MM -- DELTA EXTEND [id number] [employer] [id number] Right 1 Implanted   SCR GLENOID THRD 4.5X18MM -- DELTA EXTEND - [id number]  SCR GLENOID THRD 4.5X18MM -- DELTA EXTEND [id number] [employer] [id number] Right 1 Implanted   SCR GLENOID THRD 4.5X36MM -- DELTA EXTEND - [id number]  SCR GLENOID THRD 4.5X36MM -- DELTA EXTEND [id number] [employer] [id number] Right 1 Implanted   SCR GLENOID THRD 4.5X42MM -- DELTA EXTEND - [id number]  SCR GLENOID THRD 4.5X42MM -- DELTA EXTEND [id number] [employer] [id number] Right 1 Implanted   RSTRCTR CEM BNE BIOABSRB 10MM -- BIOSTOP G - [id number]  RSTRCTR CEM BNE  BIOABSRB 10MM -- BIOSTOP G [id number] [employer] [id number] Right 1 Implanted   STEM HUM MBLOC EPI LNG SZ1 8MM -- DELTA XTEND - [id number]  STEM HUM MBLOC EPI LNG SZ1 8MM -- DELTA XTEND [id number] [employer] [id number] Right 1 Implanted   CUP HUM STD DELT PE 42+9MM -- Irene LimboELTA XTEND - [id number]   CUP HUM STD DELT PE 42+9MM -- Irene LimboDELTA XTEND [id number] [employer] [id number] Right 1 Implanted       Orvis BrillAlan G Posta Jr., MD

## 2016-11-14 NOTE — H&P (Addendum)
[facility] HISTORY AND PHYSICAL    Subjective:     Patient is a 56 y.o. RHD MALE WITH RIGHT SHOULDER PAIN.    SEE OFFICE NOTE.    Patient Active Problem List    Diagnosis Date Noted   ??? History of right shoulder replacement 11/14/2016   ??? Instability of prosthetic shoulder joint (HCC) 11/14/2016     Past Medical History:   Diagnosis Date   ??? Anxiety    ??? Arthritis    ??? Chronic daily headache    ??? Chronic pain     head; right shoulder; lower back    ??? Current smoker     < 0.5 pack/day   ??? Depression    ??? GERD (gastroesophageal reflux disease)     tums prn    ??? Meningioma (HCC)     removed in 2015; was on the right side of brain stem; benign    ??? Pulmonary nodules     He had his baseline LDCT 03/11/2015 which demonstrated a few small lung nodules, Lung Rads 2. March 09, 2016 L DCT showed no new nodules and stable previously noted less than 6 mm nodules   ??? 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 Sleep apnea     no c-pap   ??? 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 HX OTHER SURGICAL Left 2016    VP shunt placement    ??? HX SHOULDER ARTHROSCOPY Right 10/2015   ??? HX SHOULDER REPLACEMENT Right 10/2014   ??? NEUROLOGICAL PROCEDURE UNLISTED Right 2015    Meningioma removed      Prior to Admission medications    Medication Sig Start Date End Date Taking? Authorizing Provider   cyclobenzaprine (FLEXERIL) 10 mg tablet Take 10 mg by mouth three (3) times daily as needed. Take / use AM day of surgery  per anesthesia protocols if needed. 09/10/16   Historical Provider   dextroamphetamine-amphetamine (ADDERALL) 20 mg tablet Take 20 mg by mouth every twelve (12) hoursIndications: depression.         11/04/16   Historical Provider   amitriptyline (ELAVIL) 50 mg tablet Take 50 mg by mouth nightly. 09/10/16   Historical Provider   ibuprofen (MOTRIN) 800 mg tablet Take 800 mg by mouth every eight (8)  hours as needed. 09/10/16   Historical Provider   montelukast (SINGULAIR) 10 mg tablet Take 10 mg by mouth daily. Take / use AM day of surgery  per anesthesia protocols. 09/10/16   Historical Provider   HYDROcodone-acetaminophen (NORCO) 10-325 mg tablet Take 1 Tab by mouth every four (4) hours as needed for Pain. Take / use AM day of surgery  per anesthesia protocols if needed.  Indications: Pain    Historical Provider   morphine CR (MS CONTIN) 15 mg CR tablet Take 15 mg by mouth every twelve (12) hours. Take / use AM day of surgery  per anesthesia protocols.  Indications: Chronic Pain    Historical Provider   LORazepam (ATIVAN) 2 mg tablet Take 2 mg by mouth every eight (8) hours as needed for Anxiety. Takes PRN and at bedtime   Take / use AM day of surgery  per anesthesia protocols if needed.    Historical Provider     No Known Allergies   Social History   Substance Use Topics   ???  Smoking status: Current Every Day Smoker     Packs/day: 0.25     Years: 40.00   ??? Smokeless tobacco: Never Used   ??? Alcohol use No      Family History   Problem Relation Age of Onset   ??? No Known Problems Mother    ??? Alcohol abuse Father    ??? Diabetes Other       Review of Systems  A comprehensive review of systems was negative except for that written in the HPI.    Objective:     No data found.    There were no vitals taken for this visit.  General:  Alert, cooperative, no distress, appears stated age.   Head:  Normocephalic, without obvious abnormality, atraumatic.                       Back:   Symmetric, no curvature. ROM normal. No CVA tenderness.   Lungs:   Clear to auscultation bilaterally.   Chest wall:  No tenderness or deformity.   Heart:  Regular rate and rhythm, S1, S2 normal, no murmur, click, rub or gallop.               Extremities: Extremities normal, atraumatic, no cyanosis or edema.   Pulses: 2+ and symmetric all extremities.   Skin: Skin color, texture, turgor normal. No rashes or lesions    Lymph nodes: Cervical, supraclavicular, and axillary nodes normal.   Neurologic: CNII-XII intact. Normal strength, sensation and reflexes throughout.           Assessment:     Principal Problem:    Instability of prosthetic shoulder joint (HCC) (11/14/2016)    Active Problems:    History of right shoulder replacement (11/14/2016)        Plan:     The various methods of treatment have been discussed with the patient and family.     PATIENT HAS EXHAUSTED NON-OPERATIVE MODALITIES     After consideration of risks, benefits and other options for treatment, the patient has consented to surgical intervention.    SEE OFFICE NOTE    Orvis Brill.,  MD

## 2016-11-16 NOTE — Other (Signed)
11/13/2016 ??9:07 PM - Edi, Lab In [facility]   Component Results   Component Value Flag Ref Range Units Status   Special Requests: NO SPECIAL REQUESTS   ?? Final   Culture result:    ?? Final   SA target not detected. ?? ?? ?? ?? ?? ?? ?? ?? ?? ?? ?? ?? ?? ?? ?? ?? A MRSA NEGATIVE, SA NEGATIVE test result does not preclude MRSA or SA nasal colonization.

## 2016-11-19 ENCOUNTER — Inpatient Hospital Stay: Admit: 2016-11-19 | Payer: PRIVATE HEALTH INSURANCE | Attending: Orthopaedic Surgery | Primary: Internal Medicine

## 2016-11-19 DIAGNOSIS — Z01812 Encounter for preprocedural laboratory examination: Secondary | ICD-10-CM

## 2016-11-19 NOTE — Anesthesia Pre-Procedure Evaluation (Addendum)
Anesthetic History               Review of Systems / Medical History  Patient summary reviewed, nursing notes reviewed and pertinent labs reviewed    Pulmonary        Sleep apnea: No treatment  Smoker         Neuro/Psych     seizures (Isolated episode 6 yrs ago)    Headaches (Chronic -- opioid-dependent)    Comments: Meningioma resection 3 yrs ago w subsequent placement of VP shunt. Cardiovascular                  Exercise tolerance: >4 METS     GI/Hepatic/Renal     GERD           Endo/Other        Arthritis     Other Findings            Physical Exam    Airway  Mallampati: II  TM Distance: > 6 cm  Neck ROM: decreased range of motion   Mouth opening: Normal     Cardiovascular  Regular rate and rhythm,  S1 and S2 normal,  no murmur, click, rub, or gallop             Dental    Dentition: Upper partial plate     Pulmonary  Breath sounds clear to auscultation               Abdominal  GI exam deferred       Other Findings            Anesthetic Plan    ASA: 3  Anesthesia type: general      Post-op pain plan if not by surgeon: peripheral nerve block single      Anesthetic plan and risks discussed with: Patient and Family

## 2016-11-20 ENCOUNTER — Inpatient Hospital Stay

## 2016-11-20 ENCOUNTER — Inpatient Hospital Stay
Admit: 2016-11-20 | Discharge: 2016-11-22 | Disposition: A | Discharge status: Home Health | Payer: PRIVATE HEALTH INSURANCE | Source: Ambulatory Visit | Attending: Orthopaedic Surgery | Admitting: Orthopaedic Surgery

## 2016-11-20 ENCOUNTER — Ambulatory Visit: Admit: 2016-11-20 | Payer: PRIVATE HEALTH INSURANCE | Primary: Internal Medicine

## 2016-11-20 DIAGNOSIS — T8484XA Pain due to internal orthopedic prosthetic devices, implants and grafts, initial encounter: Secondary | ICD-10-CM

## 2016-11-20 MED ORDER — NEOSTIGMINE METHYLSULFATE 1 MG/ML INJECTION
1 mg/mL | INTRAMUSCULAR | Status: DC | PRN
Start: 2016-11-20 — End: 2016-11-20
  Administered 2016-11-20: 22:00:00 via INTRAVENOUS

## 2016-11-20 MED ORDER — LACTATED RINGERS IV
INTRAVENOUS | Status: DC
Start: 2016-11-20 — End: 2016-11-20
  Administered 2016-11-20: 23:00:00 via INTRAVENOUS

## 2016-11-20 MED ORDER — SODIUM CHLORIDE 0.9 % IJ SYRG
INTRAMUSCULAR | Status: DC | PRN
Start: 2016-11-20 — End: 2016-11-20

## 2016-11-20 MED ORDER — SODIUM CHLORIDE 0.9 % IJ SYRG
INTRAMUSCULAR | Status: DC | PRN
Start: 2016-11-20 — End: 2016-11-22
  Administered 2016-11-22 (×2): via INTRAVENOUS

## 2016-11-20 MED ORDER — ONDANSETRON (PF) 4 MG/2 ML INJECTION
4 mg/2 mL | INTRAMUSCULAR | Status: AC
Start: 2016-11-20 — End: ?

## 2016-11-20 MED ORDER — NEOSTIGMINE METHYLSULFATE 3 MG/3 ML (1 MG/ML) IV SYRINGE
3 mg/ mL (1 mg/mL) | INTRAVENOUS | Status: AC
Start: 2016-11-20 — End: ?

## 2016-11-20 MED ORDER — SODIUM CHLORIDE 0.9 % INJECTION
20 mg/2 mL | Freq: Once | INTRAMUSCULAR | Status: AC
Start: 2016-11-20 — End: 2016-11-20
  Administered 2016-11-20: 15:00:00 via INTRAVENOUS

## 2016-11-20 MED ORDER — PROPOFOL 10 MG/ML IV EMUL
10 mg/mL | INTRAVENOUS | Status: DC | PRN
Start: 2016-11-20 — End: 2016-11-20
  Administered 2016-11-20: 19:00:00 via INTRAVENOUS

## 2016-11-20 MED ORDER — SODIUM CHLORIDE 0.9 % IV
INTRAVENOUS | Status: AC
Start: 2016-11-20 — End: 2016-11-21
  Administered 2016-11-21: 02:00:00 via INTRAVENOUS

## 2016-11-20 MED ORDER — SODIUM CHLORIDE 0.9 % IJ SYRG
Freq: Three times a day (TID) | INTRAMUSCULAR | Status: DC
Start: 2016-11-20 — End: 2016-11-22
  Administered 2016-11-21 – 2016-11-22 (×5): via INTRAVENOUS

## 2016-11-20 MED ORDER — FENTANYL CITRATE (PF) 50 MCG/ML IJ SOLN
50 mcg/mL | Freq: Once | INTRAMUSCULAR | Status: AC
Start: 2016-11-20 — End: 2016-11-20
  Administered 2016-11-20: 18:00:00 via INTRAVENOUS

## 2016-11-20 MED ORDER — LIDOCAINE (PF) 20 MG/ML (2 %) IJ SOLN
20 mg/mL (2 %) | INTRAMUSCULAR | Status: DC | PRN
Start: 2016-11-20 — End: 2016-11-20
  Administered 2016-11-20: 19:00:00 via INTRAVENOUS

## 2016-11-20 MED ORDER — SODIUM CHLORIDE 0.9 % INJECTION
10 mg/mL | INTRAMUSCULAR | Status: DC | PRN
Start: 2016-11-20 — End: 2016-11-20
  Administered 2016-11-20 (×5): via INTRAVENOUS

## 2016-11-20 MED ORDER — LIDOCAINE (PF) 20 MG/ML (2 %) IV SYRINGE
100 mg/5 mL (2 %) | INTRAVENOUS | Status: AC
Start: 2016-11-20 — End: ?

## 2016-11-20 MED ORDER — ONDANSETRON (PF) 4 MG/2 ML INJECTION
4 mg/2 mL | INTRAMUSCULAR | Status: DC | PRN
Start: 2016-11-20 — End: 2016-11-20
  Administered 2016-11-20: 20:00:00 via INTRAVENOUS

## 2016-11-20 MED ORDER — GLYCOPYRROLATE 0.2 MG/ML IJ SOLN
0.2 mg/mL | INTRAMUSCULAR | Status: AC
Start: 2016-11-20 — End: ?

## 2016-11-20 MED ORDER — PROPOFOL 10 MG/ML IV EMUL
10 mg/mL | INTRAVENOUS | Status: AC
Start: 2016-11-20 — End: ?

## 2016-11-20 MED ORDER — HYDROMORPHONE 2 MG TAB
2 mg | ORAL | Status: DC | PRN
Start: 2016-11-20 — End: 2016-11-21

## 2016-11-20 MED ORDER — EPHEDRINE SULFATE 50 MG/ML INJECTION SOLUTION
50 mg/mL | INTRAMUSCULAR | Status: DC | PRN
Start: 2016-11-20 — End: 2016-11-20
  Administered 2016-11-20 (×4): via INTRAVENOUS

## 2016-11-20 MED ORDER — FENTANYL CITRATE (PF) 50 MCG/ML IJ SOLN
50 mcg/mL | INTRAMUSCULAR | Status: AC
Start: 2016-11-20 — End: ?

## 2016-11-20 MED ORDER — MIDAZOLAM 1 MG/ML IJ SOLN
1 mg/mL | Freq: Once | INTRAMUSCULAR | Status: AC
Start: 2016-11-20 — End: 2016-11-20
  Administered 2016-11-20: 18:00:00 via INTRAVENOUS

## 2016-11-20 MED ORDER — SODIUM CHLORIDE 0.9 % IJ SYRG
Freq: Three times a day (TID) | INTRAMUSCULAR | Status: DC
Start: 2016-11-20 — End: 2016-11-20

## 2016-11-20 MED ORDER — EPINEPHRINE (PF) 1 MG/ML INJECTION
1 mg/mL ( mL) | INTRAMUSCULAR | Status: DC | PRN
Start: 2016-11-20 — End: 2016-11-20
  Administered 2016-11-20: 18:00:00 via INTRAMUSCULAR

## 2016-11-20 MED ORDER — ROPIVACAINE (PF) 5 MG/ML (0.5 %) INJECTION
5 mg/mL (0. %) | INTRAMUSCULAR | Status: DC | PRN
Start: 2016-11-20 — End: 2016-11-20
  Administered 2016-11-20: 18:00:00 via PERINEURAL

## 2016-11-20 MED ORDER — ADV ADDAPTOR
1000 mg | Status: AC
Start: 2016-11-20 — End: 2016-11-20
  Administered 2016-11-20: 20:00:00 via INTRAVENOUS

## 2016-11-20 MED ORDER — DOCUSATE SODIUM 100 MG CAP
100 mg | Freq: Two times a day (BID) | ORAL | Status: DC
Start: 2016-11-20 — End: 2016-11-22
  Administered 2016-11-21 – 2016-11-22 (×4): via ORAL

## 2016-11-20 MED ORDER — ROCURONIUM 10 MG/ML IV
10 mg/mL | INTRAVENOUS | Status: AC
Start: 2016-11-20 — End: ?

## 2016-11-20 MED ORDER — BISACODYL 10 MG RECTAL SUPPOSITORY
10 mg | Freq: Every day | RECTAL | Status: DC | PRN
Start: 2016-11-20 — End: 2016-11-22

## 2016-11-20 MED ORDER — VANCOMYCIN 10 GRAM IV SOLR
10 gram | Freq: Two times a day (BID) | INTRAVENOUS | Status: DC
Start: 2016-11-20 — End: 2016-11-20

## 2016-11-20 MED ORDER — SODIUM PHOSPHATES 19 GRAM-7 GRAM/118 ML ENEMA
19-7 gram/118 mL | RECTAL | Status: DC | PRN
Start: 2016-11-20 — End: 2016-11-22

## 2016-11-20 MED ORDER — HYDROMORPHONE (PF) 1 MG/ML IJ SOLN
1 mg/mL | INTRAMUSCULAR | Status: DC | PRN
Start: 2016-11-20 — End: 2016-11-22
  Administered 2016-11-21 (×3): via INTRAVENOUS

## 2016-11-20 MED ORDER — ROCURONIUM 10 MG/ML IV
10 mg/mL | INTRAVENOUS | Status: DC | PRN
Start: 2016-11-20 — End: 2016-11-20
  Administered 2016-11-20 (×4): via INTRAVENOUS

## 2016-11-20 MED ORDER — NALOXONE 0.4 MG/ML INJECTION
0.4 mg/mL | INTRAMUSCULAR | Status: DC | PRN
Start: 2016-11-20 — End: 2016-11-20

## 2016-11-20 MED ORDER — MORPHINE ER 15 MG TAB
15 mg | Freq: Two times a day (BID) | ORAL | Status: DC
Start: 2016-11-20 — End: 2016-11-22
  Administered 2016-11-21 – 2016-11-22 (×4): via ORAL

## 2016-11-20 MED ORDER — HYDROMORPHONE (PF) 2 MG/ML IJ SOLN
2 mg/mL | INTRAMUSCULAR | Status: DC | PRN
Start: 2016-11-20 — End: 2016-11-20

## 2016-11-20 MED ORDER — OXYCODONE 5 MG TAB
5 mg | Freq: Once | ORAL | Status: DC | PRN
Start: 2016-11-20 — End: 2016-11-20

## 2016-11-20 MED ORDER — LACTATED RINGERS IV
INTRAVENOUS | Status: DC
Start: 2016-11-20 — End: 2016-11-20
  Administered 2016-11-20 (×4): via INTRAVENOUS

## 2016-11-20 MED ORDER — SODIUM CHLORIDE 0.9 % INJECTION
25 mg/mL | INTRAMUSCULAR | Status: DC | PRN
Start: 2016-11-20 — End: 2016-11-20

## 2016-11-20 MED ORDER — DEXAMETHASONE SODIUM PHOSPHATE 10 MG/ML IJ SOLN
10 mg/mL | INTRAMUSCULAR | Status: AC
Start: 2016-11-20 — End: ?

## 2016-11-20 MED ORDER — GLYCOPYRROLATE 0.2 MG/ML IJ SOLN
0.2 mg/mL | INTRAMUSCULAR | Status: DC | PRN
Start: 2016-11-20 — End: 2016-11-20
  Administered 2016-11-20 (×2): via INTRAVENOUS

## 2016-11-20 MED ORDER — FERROUS SULFATE 325 MG (65 MG ELEMENTAL IRON) TAB
325 mg (65 mg iron) | Freq: Two times a day (BID) | ORAL | Status: DC
Start: 2016-11-20 — End: 2016-11-22
  Administered 2016-11-21 – 2016-11-22 (×4): via ORAL

## 2016-11-20 MED ORDER — TUBERCULIN PPD 5 UNIT/0.1 ML INTRADERMAL
5 tub. unit /0.1 mL | Freq: Once | INTRADERMAL | Status: DC
Start: 2016-11-20 — End: 2016-11-22

## 2016-11-20 MED ORDER — HYDROMORPHONE 4 MG TAB
4 mg | ORAL | Status: DC | PRN
Start: 2016-11-20 — End: 2016-11-21
  Administered 2016-11-21 (×3): via ORAL

## 2016-11-20 MED ORDER — EPHEDRINE SULFATE 50 MG/ML INJECTION SOLUTION
50 mg/mL | INTRAMUSCULAR | Status: AC
Start: 2016-11-20 — End: ?

## 2016-11-20 MED ORDER — LIDOCAINE HCL 1 % (10 MG/ML) IJ SOLN
10 mg/mL (1 %) | INTRAMUSCULAR | Status: DC | PRN
Start: 2016-11-20 — End: 2016-11-20

## 2016-11-20 MED ORDER — PROMETHAZINE 25 MG TAB
25 mg | ORAL | Status: DC | PRN
Start: 2016-11-20 — End: 2016-11-22

## 2016-11-20 MED ORDER — TEMAZEPAM 15 MG CAP
15 mg | Freq: Every evening | ORAL | Status: DC | PRN
Start: 2016-11-20 — End: 2016-11-22

## 2016-11-20 MED ORDER — DEXAMETHASONE SODIUM PHOSPHATE 10 MG/ML IJ SOLN
10 mg/mL | INTRAMUSCULAR | Status: DC | PRN
Start: 2016-11-20 — End: 2016-11-20
  Administered 2016-11-20: 20:00:00 via INTRAVENOUS

## 2016-11-20 MED ORDER — SODIUM CHLORIDE 0.9 % IV PIGGY BACK
1000 mg | INTRAVENOUS | Status: DC
Start: 2016-11-20 — End: 2016-11-20
  Administered 2016-11-20: 20:00:00 via INTRAVENOUS

## 2016-11-20 MED FILL — DEXAMETHASONE SODIUM PHOSPHATE 10 MG/ML IJ SOLN: 10 mg/mL | INTRAMUSCULAR | Qty: 1

## 2016-11-20 MED FILL — LIDOCAINE (PF) 20 MG/ML (2 %) IV SYRINGE: 100 mg/5 mL (2 %) | INTRAVENOUS | Qty: 5

## 2016-11-20 MED FILL — GLYCOPYRROLATE 0.2 MG/ML IJ SOLN: 0.2 mg/mL | INTRAMUSCULAR | Qty: 2

## 2016-11-20 MED FILL — NEOSTIGMINE METHYLSULFATE 3 MG/3 ML (1 MG/ML) IV SYRINGE: 3 mg/ mL (1 mg/mL) | INTRAVENOUS | Qty: 3

## 2016-11-20 MED FILL — ROCURONIUM 10 MG/ML IV: 10 mg/mL | INTRAVENOUS | Qty: 5

## 2016-11-20 MED FILL — EPHEDRINE SULFATE 50 MG/ML IJ SOLN: 50 mg/mL | INTRAMUSCULAR | Qty: 1

## 2016-11-20 MED FILL — FAMOTIDINE (PF) 20 MG/2 ML IV: 20 mg/2 mL | INTRAVENOUS | Qty: 2

## 2016-11-20 MED FILL — PROPOFOL 10 MG/ML IV EMUL: 10 mg/mL | INTRAVENOUS | Qty: 20

## 2016-11-20 MED FILL — VANCOMYCIN 1,000 MG IV SOLR: 1000 mg | INTRAVENOUS | Qty: 1000

## 2016-11-20 MED FILL — ONDANSETRON (PF) 4 MG/2 ML INJECTION: 4 mg/2 mL | INTRAMUSCULAR | Qty: 2

## 2016-11-20 MED FILL — FENTANYL CITRATE (PF) 50 MCG/ML IJ SOLN: 50 mcg/mL | INTRAMUSCULAR | Qty: 2

## 2016-11-20 MED FILL — MIDAZOLAM 1 MG/ML IJ SOLN: 1 mg/mL | INTRAMUSCULAR | Qty: 2

## 2016-11-20 NOTE — Anesthesia Procedure Notes (Signed)
Interscalene block with ultrasound    Start time: 11/20/2016 1:44 PM  End time: 11/20/2016 1:47 PM  Performed by: Neta MendsWHITNEY IV, Nico Rogness B  Authorized by: Neta MendsWHITNEY IV, Ludie Pavlik B       Pre-procedure:   Indications: at surgeon's request and post-op pain management    Preanesthetic Checklist: patient identified, risks and benefits discussed, site marked, timeout performed, anesthesia consent given and patient being monitored    Timeout Time: 13:44          Block Type:   Block Type:  Interscalene  Laterality:  Right  Monitoring:  Continuous pulse ox, frequent vital sign checks, heart rate, responsive to questions and oxygen  Injection Technique:  Single shot  Procedures: ultrasound guided    Patient Position: supine (head elevated 45 degrees)  Prep: chlorhexidine    Location:  Interscalene  Needle Type:  Stimuplex  Needle Gauge:  20 G  Needle Localization:  Ultrasound guidance  Medication Injected:  0.5%  ropivacaine  Adds:  Epi 1:200K  Volume (mL):  20    Assessment:  Number of attempts:  1  Injection Assessment:  Incremental injection every 5 mL, local visualized surrounding nerve on ultrasound, negative aspiration for blood, no paresthesia, ultrasound image on chart and no intravascular symptoms  Patient tolerance:  Patient tolerated the procedure well with no immediate complications

## 2016-11-20 NOTE — Op Note (Signed)
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    PROCEDURE PERFORMED:  Incision, debridement, removal of implant, right shoulder "Zimmer TM press fit TSA" with a proximal humeral osteotomy and revision right total shoulder arthroplasty with a reverse long stem Delta Xtend prosthesis, Biceps tenodesis, latissimus dorsi and teres major tendon transfer.    SURGEON:  Angel Weeks.Angel Weeks., MD.    PATHOLOGY:  Periprosthetic instability with a deficient subscapularis.    CPT CODES: 91694, [id number] and 24400.       ICD-10 CODES:  T84.028, T5950759.    IMPLANTS AND HARDWARE UTILIZED:  +10 metaglene, 42 eccentric glenosphere, 42+9 cu,  8/1 long stem, a 10 mm Biostop G, 42 mm superior, 36 mm inferior, 18 mm anterior nonlocking cortical screws.    ANESTHESIA:  General with interscalene block.    ESTIMATED BLOOD LOSS:  500 mL.    COMPLICATIONS:  None.     CERTIFIED FIRST ASSISTANT:  Angel Pinto, MD  certified first assistant. Use of a certified first assistant was necessary to maximize the patient's safety and optimize outcome due to the complexity of the operative procedure.    SPECIMENS REMOVED:  Frozen section and cultures labeled 1, 2 and 3 from the right shoulder.    INDICATIONS:  The patient is a 56 year old gentleman who was initially treated elsewhere.  The patient underwent a right total shoulder arthroplasty with a Bigliani//Flatow Zimmer TM press fit components on both the glenoid and humeral side. His postoperative course was complicated by the subscapularis rupture.  The patient underwent a second procedure consisting with open repair of his subscapularis tear.  The patient has  developed a sore painful unstable right shoulder.  Preoperative workup including laboratory values which demonstrate a normal white count, normal ESR, normal CRP and aspirate of the right shoulder were negative for infection.  EMG and nerve conduction studies demonstrate no axillary nerve injury.  The patient has exhausted extensive nonoperative measures and now elects for operative intervention.    PROCEDURE IN DETAIL:  Following identification of the patient, the patient was taken to the operative suite.  Following induction of  general anesthesia, interscalene block, for postop pain control.  No antibiotics, placement of a Foley catheter.  The patient was very carefully positioned on the operating table in the beach chair fashion.  The right arm was then prepped and draped in sterile fashion.  His previous surgical incision was identified.  It was marked and  InteguSeal was applied.  Once InteguSeal was allowed to cure a portion of his incision was utilized.  The skin was incised, subcutaneous tissue was then dissected down to the cephalic vein in the deltopectoral interval.  This was very carefully dissected free.  Deltoid was retracted laterally.  Pectoralis major muscle was retracted medially.  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  The patient had a positive popeye sign.  The deltoid was then very carefully elevated off the underlying humeral shaft and the subdeltoid bursa was mobilized.  Axillary and radial nerves were protected.  The dissection was carried out into the glenohumeral joint.  An arthrotomy was then performed.  Cultures from the joint were obtained labeled 1, 2 and 3.  The patient then received 1 gram of IV vancomycin.  A frozen section was sent.  Frozen section was negative.  There was no evident infection.  Subscapularis was noted to be torn.  The humerus is then very carefully dislocated from the wound.  The humeral head was then removed using the tuning fork.  It was noted at  this point that the TM press fit stem was well fixed.  At this point, using a bur rongeur, osteotomes and an oscillating saw, a proximal humeral osteotomy was then performed for a length of roughly 5 cm.  The humeral shaft was then mobilized.  At this point, the extractor was then applied to the proximal humeral stem and the stem was removed in its entirety without incident.  At this point, the humeral canal was then vigorously debrided and irrigated.  It was noted that an 81 long stem gave excellent stability and was appropriate size.  The humeral canal was then vigorously debrided and irrigated with 6 liters of jet pulse lavage, previous suture anchor material and sutures were removed in their entirety.  At this point, our attention was then turned to the glenoid.  The glenoid was then meticulously exposed.  The TM component was visualized with the use of the saw the polyethylene was then cut in a T like fashion and removed.  The underlying trabecular metal was then visualized with the use of the bur osteotomes and a rongeur.  The TM glenoid component was removed in its entirety.  Again, there was no evidence of any purulence.  The glenoid was then meticulously exposed.  The glenoid was then sized.  The guidewire was then passed.  The glenoid was then drilled and reamed to a +10 length.  The +10 metaglene was then inserted and secured with a 42 mm superior, 36 mm inferior and an 18 mm anterior cortical screw.  The metaglene was stable. The 42 eccentric glenosphere was inserted and secured in the standard fashion.  Metaglene glenosphere stable.  Humerus was dislocated back from the wound.  The latissimus dorsi and teres major tendons were then identified.  They were identified. They were mobilized, tagged for later posterior transfer.  Axillary and radial nerves were protected.  The biceps tendon, which was noted to be chronically torn with a popeye sign was then mobilized and brought out to length for later  repair.  At this point the humeral canal was irrigated and dried.  A 10 mm Biostop G was then placed distally.  At this point, antibiotic impregnated cement was then mixed. The cement was inserted in the humeral canal and a 8/1 long stem whose fins were preloaded with #5 sutures were cemented in appropriate version.  Excess cement was removed with a curette.  Once the cement was allowed to cure,  a true 42+9 cup was inserted.  Shoulder was reduced.  There is excellent stability with excellent mobility. At   this point, latissimus dorsi, and teres major tendons were then transferred posteriorly and secured with #5 and #2 Mersilene sutures.  The deltopectoral interval was approximated the #5 Mersilene sutures.  The biceps tendon was brought out to length and tenodesed using #5 mersilene sutures.  At this point, the arm was put through range of motion and stable.  At this point, the wound was then irrigated, The skin was then closed with 0 Vicryl figure-of-eight sutures and 0 Prolene subcuticular stitch.  A sterile gauze, fluffs and swathe was applied.  The patient was then transferred  to the recovery in stable condition.      CPT Codes are 42683, [id number], 22979.     ICD-10 Codes, T84.028, [id number].     HARDWARE UTILIZED:  +10 metaglene, 42 eccentric glenosphere, 42+9 cup, 8/1 long stem, 10 mm Biostop G, 42 mm superior, 36 mm inferior 18 mm anterior cortical screw.     Cultures were sent from the right shoulder labeled 1, 2 and 3 and frozen section was sent which was negative.  Axillary and radial nerves were protected throughout the procedure.      Angel Morrow, MD       AGP / VN  D: 11/20/2016 18:53     T: 11/21/2016 00:10  JOB #: [id number]  CC: Samuel Jester, MD

## 2016-11-20 NOTE — Discharge Summary (Signed)
[facility] Discharge Summary      Patient ID:  Angel DillonJames Stephen Weeks  [id number]  56 y.o.  [birth year]    Allergies: Review of patient's allergies indicates no known allergies.    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]    * Discharge Diagnoses:   Hospital Problems as of 11/20/2016  Date Reviewed: 11/19/2016          Codes Class Noted - Resolved POA    Anemia associated with acute blood loss ICD-10-CM: D62  ICD-9-CM: 285.1  11/20/2016 - Present Yes        History of right shoulder replacement ICD-10-CM: Z96.611  ICD-9-CM: V43.61  11/14/2016 - Present Yes        * (Principal)Instability of prosthetic shoulder joint (HCC) ICD-10-CM: T84.028A, Z96.619  ICD-9-CM: 996.42, V43.61  11/14/2016 - Present Yes        RESOLVED: Pain due to right shoulder joint prosthesis (HCC) ICD-10-CM: T84.84XA, Z96.611  ICD-9-CM: 996.77, 719.41, V43.61  11/14/2016 - 11/14/2016 Yes              Surgeon: Orvis BrillAlan G Posta Jr., MD        * Procedure: Procedure(s):  RIGHT SHOULDER REMOVAL OF ZIMMER IMPLANTS AND REVISION TO SHOULDER ARTHROPLASTY TOTAL REVERSE?? BICEPS TENODESIS IN COMBINATION WITH LATISSIMUS DORSI AND TERES MAJOR TENDON TRANSFER/FROZEN SECTIONS AND CULTURES/POSS REMEDY SPACER IMPLANT/ POSS OSTEOTOMY  GEN/SCAL BLOCK ZIMMER REMOVAL,DELTA XTEND W/LONG STEM, REMEDY BACKUP           Perioperative Antibiotics: Ancef  ___                                                Vancomycin  _X__          Post Op complications: none        * Discharge Condition: good  Wound appears to be healing without any evidence of infection.         * Discharged to: Home    * Follow-up Care/Discharge instructions:  - Resume pre hospital diet            - Resume home medications per  medical continuation form     CONTINUE PHYSICAL THERAPY  SLING RIGHT SHOULDER  - Follow up in office as scheduled       Signed:  Orvis BrillAlan G Posta Jr., MD  11/20/2016  11:28 AM

## 2016-11-20 NOTE — Consults (Signed)
Hospitalist post-op consult    Patient is s/p right shoulder revision. He has some headache but says this is chronic. He has a VP shunt since 2016. No neurologic deficits. Home meds reviewed, renewed necessary meds. No recent seizure activity. Right arm still numb from nerve block but neurovascularly intact. No acute medical issues identified.  No charge for patient visit. Available for new medical problems if they arise.

## 2016-11-20 NOTE — Discharge Summary (Signed)
[facility]charge Summary      Patient ID:  Angel DillonJames Stephen Weeks  [id number]  56 y.o.  [birth year]    Allergies: Review of patient's allergies indicates no known allergies.    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]    * Discharge Diagnoses:   Hospital Problems as of 11/20/2016  Date Reviewed: 11/19/2016          Codes Class Noted - Resolved POA    Anemia associated with acute blood loss ICD-10-CM: D62  ICD-9-CM: 285.1  11/20/2016 - Present Yes        History of right shoulder replacement ICD-10-CM: Z96.611  ICD-9-CM: V43.61  11/14/2016 - Present Yes        * (Principal)Instability of prosthetic shoulder joint (HCC) ICD-10-CM: T84.028A, Z96.619  ICD-9-CM: 996.42, V43.61  11/14/2016 - Present Yes        RESOLVED: Pain due to right shoulder joint prosthesis (HCC) ICD-10-CM: T84.84XA, Z96.611  ICD-9-CM: 996.77, 719.41, V43.61  11/14/2016 - 11/14/2016 Yes              Surgeon: Orvis BrillAlan G Posta Jr., MD        * Procedure: Procedure(s):  RIGHT SHOULDER REMOVAL OF ZIMMER IMPLANTS AND REVISION TO SHOULDER ARTHROPLASTY TOTAL REVERSE?? BICEPS TENODESIS IN COMBINATION WITH LATISSIMUS DORSI AND TERES MAJOR TENDON TRANSFER/FROZEN SECTIONS AND CULTURES/POSS REMEDY SPACER IMPLANT/ POSS OSTEOTOMY  GEN/SCAL BLOCK ZIMMER REMOVAL,DELTA XTEND W/LONG STEM, REMEDY BACKUP           Perioperative Antibiotics: Ancef  ___                                                Vancomycin  _X__          Post Op complications: none        * Discharge Condition: good  Wound appears to be healing without any evidence of infection.         * Discharged to: Home    * Follow-up Care/Discharge instructions:  - Resume pre hospital diet             - Resume home medications per medical continuation form     CONTINUE PHYSICAL THERAPY  SLING RIGHT SHOULDER  - Follow up in office as scheduled       Signed:  Orvis BrillAlan G Posta Jr., MD  11/20/2016  11:28 AM

## 2016-11-20 NOTE — Progress Notes (Signed)
Doing well  Continue vanco until final cultures negative  Home Monday if stable

## 2016-11-20 NOTE — Anesthesia Procedure Notes (Signed)
Interscalene block with ultrasound    Start time: 11/20/2016 1:44 PM  End time: 11/20/2016 1:47 PM  Performed by: WHITNEY IV, Holley Kocurek B  Authorized by: WHITNEY IV, Davison Ohms B       Pre-procedure:   Indications: at surgeon's request and post-op pain management    Preanesthetic Checklist: patient identified, risks and benefits discussed, site marked, timeout performed, anesthesia consent given and patient being monitored    Timeout Time: 13:44          Block Type:   Block Type:  Interscalene  Laterality:  Right  Monitoring:  Continuous pulse ox, frequent vital sign checks, heart rate, responsive to questions and oxygen  Injection Technique:  Single shot  Procedures: ultrasound guided    Patient Position: supine (head elevated 45 degrees)  Prep: chlorhexidine    Location:  Interscalene  Needle Type:  Stimuplex  Needle Gauge:  20 G  Needle Localization:  Ultrasound guidance  Medication Injected:  0.5%  ropivacaine  Adds:  Epi 1:200K  Volume (mL):  20    Assessment:  Number of attempts:  1  Injection Assessment:  Incremental injection every 5 mL, local visualized surrounding nerve on ultrasound, negative aspiration for blood, no paresthesia, ultrasound image on chart and no intravascular symptoms  Patient tolerance:  Patient tolerated the procedure well with no immediate complications

## 2016-11-20 NOTE — Progress Notes (Signed)
Initial visit by chaplain to convey care and concern and encourage patient that chaplain services are available if desired. No needs were voiced during the visit. Provided business card for future reference.     Adrian Duckett, MDiv  Board Certified Chaplain

## 2016-11-20 NOTE — H&P (Signed)
Date of Surgery Update:  Angel Weeks was seen and examined.  History and physical has been reviewed. The patient has been examined. There have been no significant clinical changes since the completion of the originally dated History and Physical.    Signed By: Orvis BrillAlan G Posta Jr., MD     November 20, 2016 11:00 AM

## 2016-11-20 NOTE — Progress Notes (Signed)
Sat monitor # 10 @ bedside.  Alarms set per protocol.  PEP therapy contraindicated @ this time.  Pt c/o headache, and states he has shunt due to brain tumor.

## 2016-11-20 NOTE — Progress Notes (Signed)
Blood bank bracelet noted to be to tight on left arm , notified lab to have someone come and apply a new one

## 2016-11-20 NOTE — Other (Signed)
2% Chlorhexidine Gluconate skin prep applied to right shoulder per guidelines.

## 2016-11-20 NOTE — Anesthesia Post-Procedure Evaluation (Signed)
Post-Anesthesia Evaluation and Assessment    Patient: Angel DillonJames Stephen Safi MRN: [id number]  SSN: [id number]    Date of Birth: 06-06-1960  Age: 56 y.o.  Sex: male       Cardiovascular Function/Vital Signs  Visit Vitals   ??? BP 120/64 (BP 1 Location: Left arm, BP Patient Position: At rest)   ??? Pulse 78   ??? Temp 36.6 ??C (97.8 ??F)   ??? Resp 20   ??? Wt 88.1 kg (194 lb 3.2 oz)   ??? SpO2 94%   ??? BMI 27.86 kg/m2       Patient is status post general anesthesia for Procedure(s):  INCISION, DEBRIDEMENT, REMOVAL IMPLANT RIGHT SHOULDER "ZIMMER TM PRESS FIT TSA" WITH PROXIMAL  HUMERAL OSTEOTOMY                                                                   2.  REVISION RIGHT TOTAL SHOULDER ARTHROPLASTY WITH REVERSE LONG STEM DELTA EXTEND PROSTHESIS, BICEPS TENODESIS, LATISSIMUS DORSI AND TERES MAJOR TENDON TRANSFERS.    Nausea/Vomiting: None    Postoperative hydration reviewed and adequate.    Pain:    Managed    Neurological Status:   Neuro (WDL): Exceptions to WDL (11/20/16 1900)  Neuro  Neurologic State: Alert (11/20/16 1928)  Orientation Level: Oriented X4 (11/20/16 1928)  Cognition: Appropriate decision making;Appropriate for age attention/concentration;Appropriate safety awareness (11/20/16 1928)  Speech: Clear (11/20/16 1928)  LUE Motor Response: Purposeful (11/20/16 1928)  LLE Motor Response: Purposeful (11/20/16 1928)  RUE Motor Response: Pharmocologically paralyzed (11/20/16 1928)  RLE Motor Response: Purposeful (11/20/16 1928)   At baseline    Mental Status and Level of Consciousness: Arousable    Pulmonary Status:     Adequate oxygenation and airway patent    Complications related to anesthesia: None    Post-anesthesia assessment completed. No concerns    Signed By: Neta MendsJohn B Whitney IV, MD     November 20, 2016

## 2016-11-20 NOTE — Consults (Signed)
Hospitalist post-op consult    Patient is s/p right shoulder revision. He has some headache but says this is chronic. He has a VP shunt since 2016. No neurologic deficits. Home meds reviewed, renewed necessary meds. No recent seizure activity. Right arm still numb from nerve block but neurovascularly intact. No acute medical issues identified.  No charge for patient visit. Available for new medical problems if they arise.

## 2016-11-21 LAB — CBC W/O DIFF
HCT: 35.7 % — ABNORMAL LOW (ref 41.1–50.3)
HGB: 12 g/dL — ABNORMAL LOW (ref 13.6–17.2)
MCH: 29.7 PG (ref 26.1–32.9)
MCHC: 33.6 g/dL (ref 31.4–35.0)
MCV: 88.4 FL (ref 79.6–97.8)
MPV: 9.4 FL — ABNORMAL LOW (ref 10.8–14.1)
PLATELET: 284 10*3/uL (ref 150–450)
RBC: 4.04 M/uL — ABNORMAL LOW (ref 4.23–5.67)
RDW: 13.3 % (ref 11.9–14.6)
WBC: 12 10*3/uL — ABNORMAL HIGH (ref 4.3–11.1)

## 2016-11-21 LAB — PLEASE READ & DOCUMENT PPD TEST IN 24 HRS: mm Induration: 0 mm

## 2016-11-21 LAB — METABOLIC PANEL, BASIC
Anion gap: 8 mmol/L (ref 7–16)
BUN: 8 MG/DL (ref 6–23)
CO2: 26 mmol/L (ref 21–32)
Calcium: 8.6 MG/DL (ref 8.3–10.4)
Chloride: 105 mmol/L (ref 98–107)
Creatinine: 0.9 MG/DL (ref 0.8–1.5)
GFR est AA: >60 mL/min/{1.73_m2} (ref >60)
GFR est non-AA: >60 mL/min/{1.73_m2} (ref >60)
Glucose: 118 mg/dL — ABNORMAL HIGH (ref 65–100)
Potassium: 4.4 mmol/L (ref 3.5–5.1)
Sodium: 139 mmol/L (ref 136–145)

## 2016-11-21 LAB — MAGNESIUM: Magnesium: 2 mg/dL (ref 1.8–2.4)

## 2016-11-21 MED ORDER — LORAZEPAM 1 MG TAB
1 mg | Freq: Three times a day (TID) | ORAL | Status: DC | PRN
Start: 2016-11-21 — End: 2016-11-22
  Administered 2016-11-21 – 2016-11-22 (×2): via ORAL

## 2016-11-21 MED ORDER — ACETAMINOPHEN 500 MG TAB
500 mg | Freq: Four times a day (QID) | ORAL | Status: DC | PRN
Start: 2016-11-21 — End: 2016-11-21
  Administered 2016-11-21 (×2): via ORAL

## 2016-11-21 MED ORDER — HYDROCODONE-ACETAMINOPHEN 7.5 MG-325 MG TAB
Freq: Four times a day (QID) | ORAL | Status: DC | PRN
Start: 2016-11-21 — End: 2016-11-22
  Administered 2016-11-21 – 2016-11-22 (×3): via ORAL

## 2016-11-21 MED ORDER — ADV ADDAPTOR
1000 mg | Freq: Two times a day (BID) | Status: DC
Start: 2016-11-21 — End: 2016-11-22
  Administered 2016-11-21 – 2016-11-22 (×3): via INTRAVENOUS

## 2016-11-21 MED FILL — HYDROMORPHONE 4 MG TAB: 4 mg | ORAL | Qty: 1

## 2016-11-21 MED FILL — SODIUM CHLORIDE 0.9 % IV: INTRAVENOUS | Qty: 1000

## 2016-11-21 MED FILL — VANCOMYCIN 1,000 MG IV SOLR: 1000 mg | INTRAVENOUS | Qty: 1000

## 2016-11-21 MED FILL — FERROUS SULFATE 325 MG (65 MG ELEMENTAL IRON) TAB: 325 mg (65 mg iron) | ORAL | Qty: 1

## 2016-11-21 MED FILL — DOCUSATE SODIUM 100 MG CAP: 100 mg | ORAL | Qty: 1

## 2016-11-21 MED FILL — HYDROMORPHONE (PF) 1 MG/ML IJ SOLN: 1 mg/mL | INTRAMUSCULAR | Qty: 1

## 2016-11-21 MED FILL — LORAZEPAM 1 MG TAB: 1 mg | ORAL | Qty: 2

## 2016-11-21 MED FILL — MAPAP EXTRA STRENGTH 500 MG TABLET: 500 mg | ORAL | Qty: 2

## 2016-11-21 MED FILL — HYDROCODONE-ACETAMINOPHEN 7.5 MG-325 MG TAB: ORAL | Qty: 2

## 2016-11-21 MED FILL — MORPHINE ER 15 MG TAB: 15 mg | ORAL | Qty: 1

## 2016-11-21 NOTE — Progress Notes (Signed)
Foley catheter taken out,,,PT personnel in to work with patient arm exercises...Marland Kitchen..Marland Kitchen

## 2016-11-21 NOTE — Progress Notes (Addendum)
Problem: Mobility Impaired (Adult and Pediatric)  Goal: *Acute Goals and Plan of Care (Insert Text)  GOALS (1-4 days):  (1.)  Patient will move from supine to sit and sit to supine  in bed with MODIFIED INDEPENDENCE.    (2.)  Patient will transfer from bed to chair and chair to bed with SUPERVISION using the least restrictive device.    (3.)  Patient will ambulate with SUPERVISION for 200 feet with the least restrictive device.   (4.)  Patient will be safe, with caregiver's help,with shoulder HEP to increase range of motion per MD orders.  ________________________________________________________________________________________________    PHYSICAL THERAPY: Initial Assessment, Treatment Day: Day of Assessment, AM 11/21/2016  INPATIENT: Hospital Day: 2  Payor: ABSOLUTE TOTAL CARE / [place] / Product Type: Managed Care Medicaid /      NAME/AGE/GENDER: Angel Weeks is a 56 y.o. 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  REVISION RIGHT TOTAL SHOULDER ARTHROPLASTY WITH REVERSE LONG STEM DELTA EXTEND PROSTHESIS, BICEPS TENODESIS, LATISSIMUS DORSI AND TERES MAJOR TENDON TRANSFERS (Right)  1 Day Post-Op  ICD-10: Treatment Diagnosis:   ?? Pain in Right Shoulder (M25.511)  ?? Stiffness of Right Shoulder, Not elsewhere classified (M25.611)  ?? Generalized Muscle Weakness (M62.81)  ?? Difficulty in walking, Not elsewhere classified (R26.2)   Precaution/Allergies:  Review of patient's allergies indicates no known allergies.      ASSESSMENT:      Angel Weeks presents with painful & stiff right UE along with unsteady functional mobility. This pt will benefit from follow up therapy to help restore safe function & establish HEP prior to returning home with caregiver.  Nursing staff alerted that pt is not compliant with safety & needs to be on chair / bed alarm.    This section established at most recent assessment   PROBLEM LIST (Impairments causing functional limitations):  1. Decreased Independence with Bed Mobility  2. Decreased Independence with Transfers  3. Decreased Independence with Ambulation   4. Decreased Independence with shoulder HEP   INTERVENTIONS PLANNED: (Benefits and precautions of physical therapy have been discussed with the patient.)  1. Bed Mobility Training  2. Transfer Training  3. Gait Training  4. Therapeutic Exercises per MD orders  5. Modalities for Pain     TREATMENT PLAN: Frequency/Duration: twice daily for duration of hospital stay  Rehabilitation Potential For Stated Goals: Good     RECOMMENDED REHABILITATION/EQUIPMENT: (at time of discharge pending progress): Continue Skilled Therapy and Home Health: Physical Therapy.              HISTORY:   History of Present Injury/Illness (Reason for Referral):  Painful right shoulder / s/p revision of right reverse TSA  Past Medical History/Comorbidities:   Angel Weeks  has a past medical history of Anxiety; Arthritis; Chronic daily  headache; Chronic pain; Current smoker; Depression; GERD (gastroesophageal reflux disease); Meningioma (HCC); Pulmonary nodules; Seizures (HCC) (2010); Sleep apnea; and VP (ventriculoperitoneal) shunt status (2016). He also has no past medical history of Adverse effect of anesthesia; Aneurysm (HCC); Arrhythmia; Asthma; Autoimmune disease (HCC); CAD (coronary artery disease); Cancer (HCC); Chronic kidney disease; Chronic obstructive pulmonary disease (HCC); Coagulation disorder (HCC); Diabetes (HCC); Difficult intubation; Endocarditis; Heart failure (HCC);  Hypertension; Ill-defined condition; Liver disease; Malignant hyperthermia due to anesthesia; Morbid obesity (HCC); Nausea & vomiting; Nicotine vapor product user; Non-nicotine vapor product user; Pseudocholinesterase deficiency; PUD (peptic ulcer disease); Rheumatic fever; Stroke [facility]); Thromboembolus (HCC); or Thyroid disease.  Angel Weeks  has a past surgical history that includes pr neurological procedure unlisted (Right, 2015); hx other surgical (Left, 2016); hx shoulder replacement (Right, 10/2014); and hx shoulder arthroscopy (Right, 10/2015).  Social History/Living Environment:   Home Environment: Private residence  # Steps to Enter: 5  Rails to Enter: Yes  Secondary school teacher : Right  One/Two Story Residence: One story  Living Alone: No  Support Systems: Parent  Patient Expects to be Discharged to:: Private residence  Current DME Used/Available at Home: None  Prior Level of Function/Work/Activity:  Pt was independent without an assistive device prior to this admission   Number of Personal Factors/Comorbidities that affect the Plan of Care: 3+: HIGH COMPLEXITY   EXAMINATION:   Most Recent Physical Functioning:   Gross Assessment:  AROM: Within functional limits (left UE & both LE's)  Strength: Within functional limits (left UE & both LE's)  Coordination: Within functional limits (left UE & both LE's)                    Balance:  Sitting: Intact;Without support  Standing: Impaired;Without support Bed Mobility:  Supine to Sit: Contact guard assistance  Sit to Supine:  (NT)  Scooting: Contact guard assistance       Transfers:  Sit to Stand: Minimum assistance  Stand to Sit: Minimum assistance  Bed to Chair: Minimum assistance (with V pole)  Gait:     Speed/Cadence: Shuffled;Slow  Step Length: Left shortened;Right shortened  Gait Abnormalities: Decreased step clearance;Trunk sway increased  Distance (ft): 40 Feet (ft)  Assistive Device:  (IV pole)  Ambulation - Level of Assistance: Minimal assistance    Functional Mobility:         Gait/Ambulation:  min        Transfers:  min        Bed Mobility:  cga   Body Structures Involved:  1. Joints  2. Muscles Body Functions Affected:  1. Sensory/Pain  2. Movement Related Activities and Participation Affected:  1. General Tasks and Demands  2. Mobility   Number of elements that affect the Plan of Care: 4+: HIGH COMPLEXITY   CLINICAL PRESENTATION:   Presentation: Stable and uncomplicated: LOW COMPLEXITY   CLINICAL DECISION MAKING:   [employer] AM-PAC??? ???6 Clicks???   Basic Mobility Inpatient Short Form  How much difficulty does the patient currently have... Unable A Lot A Little None   1.  Turning over in bed (including adjusting bedclothes, sheets and blankets)?   []  1   []  2   [x]  3   []  4   2.  Sitting down on and standing up from a chair with arms ( e.g., wheelchair, bedside commode, etc.)   []  1   []  2   [x]  3   []  4   3.  Moving from lying on back to sitting  on the side of the bed?   []  1   []  2   [x]  3   []  4   How much help from another person does the patient currently need... Total A Lot A Little None   4.  Moving to and from a bed to a chair (including a wheelchair)?   []  1   []  2   [x]  3   []  4   5.  Need to walk in hospital room?   []  1   []  2   [x]  3   []  4   6.  Climbing 3-5 steps with a railing?   [x]  1   []  2   []  3   []  4   ?? 2007, [employer], under license to [employer]. All rights reserved      Score:  Initial: 16 Most Recent: X (Date: -- )    Interpretation of Tool:  Represents activities that are increasingly more difficult (i.e. Bed mobility, Transfers, Gait).   Score 24 23 22-20 19-15 14-10 9-7 6     Modifier CH CI CJ CK CL CM CN      ? Mobility - Walking and Moving Around:    [id number] - CURRENT STATUS: CK - 40%-59% impaired, limited or restricted   G8979 - GOAL STATUS: CJ - 20%-39% impaired, limited or restricted   [id number] - D/C STATUS:  ---------------To be determined---------------   Payor: ABSOLUTE TOTAL CARE / [place] / Product Type: Managed Care Medicaid /      Medical Necessity:     ?? Patient is expected to demonstrate progress in strength, range of motion, balance, coordination and functional technique to decrease assistance required with b ed mobility, transfers & gait.  Reason for Services/Other Comments:  ?? Patient continues to require skilled intervention due to pt not safe with functional mobility & HEP.   Use of outcome tool(s) and clinical judgement create a POC that gives a: Clear prediction of patient's progress: LOW COMPLEXITY            TREATMENT:   (In addition to Assessment/Re-Assessment sessions the following treatments were rendered)   Pre-treatment Symptoms/Complaints:  Discomfort with all activity  Pain: Initial: visual scale  Pain Intensity 1: 5  Pain Location 1: Shoulder  Pain Orientation 1: Right  Pain Intervention(s) 1: Repositioned  Post Session:  5/10     Therapeutic Exercise: (13 Minutes):  Exercises per grid below to improve mobility and dynamic movement of arm - right to improve functional endurance.  Required minimal verbal cues to promote proper body alignment and promote proper body mechanics.  Progressed repetitions as indicated.  Assessment/12 min     Date:  6/30 Date:   Date:     ACTIVITY/EXERCISE AM PM AM PM AM PM   Gripping 15        Wrist Flexion/Extension 15        Wrist Ulnar/Radial Deviation         Pronation/Supination 15        Elbow Flexion/Extension 15aa        Shoulder Flexion/Extension         Shoulder AB/ADduction         Shoulder IR/ER         Pulleys         Pendulums 15p clock & counter clockwise        Shrugs         Isometric:  Flexion         Extension         ABduction         ADduction         Biceps/Triceps                  B = bilateral; AA = active assistive; A = active; P = passive  Education:  [x]   Home Exercises  [x]   Sling Application   [x]   Movement Precautions    []   Pulleys   []   Use of Ice   []   Other:   Treatment/Session Assessment:    ?? Response to Treatment:  tolerated fair  ?? Interdisciplinary Collaboration:   o Registered Nurse  o Certified Nursing Assistant/Patient Care Technician  ?? After treatment position/precautions:   o Up in chair  o Bed/Chair-wheels locked  o Call light within reach  o RN notified   ?? Compliance with Program/Exercises: Will assess as treatment progresses.  ?? Recommendations/Intent for next treatment session:  Treatment next visit will focus on increasing Mr. Crain's independence with bed mobility, transfers, gait training, strength/ROM exercises, modalities for pain, and patient education.   Total Treatment Duration:  PT Patient Time In/Time Out  Time In: 0835  Time Out: 0900    Maryan Puls, PT

## 2016-11-21 NOTE — Progress Notes (Addendum)
Patient resting in bed, alert and oriented, no distress noted.   Patient stated that this is the worst hospital he has ever been in because he was not given any of the correct pain medication.  Patient stated that he had to have his mother bring in his own pain medication from home.  Informed patient that he could not have his own medication because nurses have to administer pain medications.  Right shoulder dressing c/d/i, voiding clear yellow urine, ambulates to bathroom.  Neurovascular and peripheral vascular checks WNL.  Bed low and locked position.  Call light within reach.  Patient instructed to call for assistance, verbalizes understanding.  Nursing assessment complete.

## 2016-11-21 NOTE — Progress Notes (Signed)
In bed,,requested for another pain medication and his Ativan medication,,given two tablets Norco (7.5 mgs.each) and two tablets Ativan (1 mg.each),prn for anxiety....Marland Kitchen..Marland Kitchen

## 2016-11-21 NOTE — Progress Notes (Signed)
Specifically questioned patient about medications he had taken that his mother had brought in.   Patient then stated that he had only two pills which he took the night of the 29th but that he did not have any other medications in his room.

## 2016-11-21 NOTE — Progress Notes (Signed)
Problem: Mobility Impaired (Adult and Pediatric)  Goal: *Acute Goals and Plan of Care (Insert Text)  GOALS (1-4 days):  (1.)  Patient will move from supine to sit and sit to supine  in bed with MODIFIED INDEPENDENCE.  Met 6/30  (2.)  Patient will transfer from bed to chair and chair to bed with SUPERVISION using the least restrictive device. Met 6/30   (3.)  Patient will ambulate with SUPERVISION for 200 feet with the least restrictive device. Met 6/30  (4.)  Patient will be safe, with caregiver's help,with shoulder HEP to increase range of motion per MD orders.  ________________________________________________________________________________________________    PHYSICAL THERAPY: Daily Note, Treatment Day: Day of Assessment, PM 11/21/2016  INPATIENT: Hospital Day: 2  Payor: ABSOLUTE TOTAL CARE / [place] / Product Type: Managed Care Medicaid /      NAME/AGE/GENDER: Briyan Kleven is a 56 y.o. 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  REVISION RIGHT TOTAL SHOULDER ARTHROPLASTY WITH REVERSE LONG STEM DELTA EXTEND PROSTHESIS, BICEPS TENODESIS, LATISSIMUS DORSI AND TERES MAJOR TENDON TRANSFERS (Right)  1 Day Post-Op  ICD-10: Treatment Diagnosis:   ?? Pain in Right Shoulder (M25.511)  ?? Stiffness of Right Shoulder, Not elsewhere classified (M25.611)  ?? Generalized Muscle Weakness (M62.81)  ?? Difficulty in walking, Not elsewhere classified (R26.2)   Precaution/Allergies:  Review of patient's allergies indicates no known allergies.      ASSESSMENT:      Mr. Seabrooks presents with painful & stiff right UE along with unsteady functional mobility. This pt will benefit from follow up therapy to help restore safe function & establish HEP prior to returning home with caregiver.  Nursing staff alerted in am that pt is not compliant with safety & needs to be on chair / bed alarm.  In pm session pt showed more stability on his feet & better pain control.    This section established at most recent assessment   PROBLEM LIST (Impairments causing functional limitations):  1. Decreased Independence with Bed Mobility  2. Decreased Independence with Transfers  3. Decreased Independence with Ambulation   4. Decreased Independence with shoulder HEP   INTERVENTIONS PLANNED: (Benefits and precautions of physical therapy have been discussed with the patient.)  1. Bed Mobility Training  2. Transfer Training  3. Gait Training  4. Therapeutic Exercises per MD orders  5. Modalities for Pain     TREATMENT PLAN: Frequency/Duration: twice daily for duration of hospital stay  Rehabilitation Potential For Stated Goals: Good     RECOMMENDED REHABILITATION/EQUIPMENT: (at time of discharge pending progress): Continue Skilled Therapy and Home Health: Physical Therapy.              HISTORY:   History of Present Injury/Illness (Reason for Referral):  Painful right shoulder / s/p revision of right reverse  TSA  Past Medical History/Comorbidities:   Mr. Markwood  has a past medical history of Anxiety; Arthritis; Chronic daily headache; Chronic pain; Current smoker; Depression; GERD (gastroesophageal reflux disease); Meningioma ([place]); Pulmonary nodules; Seizures ([place]) (2010); Sleep apnea; and VP (ventriculoperitoneal) shunt status (2016). He also has no past medical history of Adverse effect of anesthesia; Aneurysm ([place]); Arrhythmia; Asthma; Autoimmune disease ([place]); CAD (coronary artery disease); Cancer ([place]); Chronic kidney disease; Chronic obstructive pulmonary disease ([place]); Coagulation disorder ([place]);  Diabetes ([place]); Difficult intubation; Endocarditis; Heart failure ([place]); Hypertension; Ill-defined condition; Liver disease; Malignant hyperthermia due to anesthesia; Morbid obesity ([place]); Nausea & vomiting; Nicotine vapor product user; Non-nicotine vapor product user; Pseudocholinesterase deficiency; PUD (peptic ulcer disease); Rheumatic fever; Stroke [facility]); Thromboembolus ([place]); or Thyroid disease.  Mr. Dyke  has a past surgical history that includes pr neurological procedure unlisted (Right, 2015); hx other surgical (Left, 2016); hx shoulder replacement (Right, 10/2014); and hx shoulder arthroscopy (Right, 10/2015).  Social History/Living Environment:   Home Environment: Private residence  # Steps to Enter: 5  Rails to Enter: Yes  Science writer : Right  One/Two Story Residence: One story  Living Alone: No  Support Systems: Parent  Patient Expects to be Discharged to:: Private residence  Current DME Used/Available at Home: None  Prior Level of Function/Work/Activity:  Pt was independent without an assistive device prior to this admission   Number of Personal Factors/Comorbidities that affect the Plan of Care: 3+: HIGH COMPLEXITY   EXAMINATION:   Most Recent Physical Functioning:   Gross Assessment:  AROM: Within functional limits (left UE & both LE's)  Strength: Within functional limits (left UE & both LE's)  Coordination: Within functional limits (left UE & both LE's)                    Balance:  Sitting: Intact;Without support  Standing: Intact;Without support Bed Mobility:  Supine to Sit: Modified independent  Sit to Supine: Modified independent  Scooting: Independent       Transfers:  Sit to Stand: Supervision  Stand to Sit: Supervision  Bed to Chair: Supervision  Gait:     Speed/Cadence: Fluctuations  Step Length:  (equal)  Gait Abnormalities: Decreased step clearance  Distance (ft): 400 Feet (ft)  Assistive Device:  (none)  Ambulation - Level of Assistance: Supervision  Number of Stairs Trained: 12   Stairs - Level of Assistance: Supervision  Rail Use: Left   Duration: 11 Minutes   Functional Mobility:         Gait/Ambulation:  sup        Transfers:  sup        Bed Mobility:  Mod I   Body Structures Involved:  1. Joints  2. Muscles Body Functions Affected:  1. Sensory/Pain  2. Movement Related Activities and Participation Affected:  1. General Tasks and Demands  2. Mobility   Number of elements that affect the Plan of Care: 4+: HIGH COMPLEXITY   CLINICAL PRESENTATION:   Presentation: Stable and uncomplicated: LOW COMPLEXITY   CLINICAL DECISION MAKING:   [facility]	[place] AM-PAC??? ???6 Clicks???   Basic Mobility Inpatient Short Form  How much difficulty does the patient currently have... Unable A Lot A Little None   1.  Turning over in bed (including adjusting bedclothes, sheets and blankets)?   [] 1   [] 2   [x] 3   [] 4   2.  Sitting down on and standing up from a chair with arms ( e.g., wheelchair, bedside  commode, etc.)   [] 1   [] 2   [x] 3   [] 4   3.  Moving from lying on back to sitting on the side of the bed?   [] 1   [] 2   [x] 3   [] 4   How much help from another person does the patient currently need... Total A Lot A Little None   4.  Moving to and from a bed to a chair (including a wheelchair)?   [] 1   [] 2   [x] 3   [] 4   5.  Need to walk in hospital room?   [] 1   [] 2   [x] 3   [] 4   6.  Climbing 3-5 steps with a railing?   [x] 1   [] 2   [] 3   [] 4   ?? 2007, Trustees of [employer], under license to [employer]. All rights reserved      Score:  Initial: 16 Most Recent: X (Date: -- )    Interpretation of Tool:  Represents activities that are increasingly more difficult (i.e. Bed mobility, Transfers, Gait).   Score 24 23 22-20 19-15 14-10 9-7 6     Modifier CH CI CJ CK CL CM CN      ? Mobility - Walking and Moving Around:    N9892 - CURRENT STATUS: CK - 40%-59% impaired, limited or restricted   G8979 - GOAL STATUS: CJ - 20%-39% impaired, limited or restricted    J1941 - D/C STATUS:  ---------------To be determined---------------  Payor: ABSOLUTE TOTAL CARE / [place] / Product Type: Managed Care Medicaid /      Medical Necessity:     ?? Patient is expected to demonstrate progress in strength, range of motion, balance, coordination and functional technique to decrease assistance required with b ed mobility, transfers & gait.  Reason for Services/Other Comments:  ?? Patient continues to require skilled intervention due to pt not safe with functional mobility & HEP.   Use of outcome tool(s) and clinical judgement create a POC that gives a: Clear prediction of patient's progress: LOW COMPLEXITY            TREATMENT:   (In addition to Assessment/Re-Assessment sessions the following treatments were rendered)   Pre-treatment Symptoms/Complaints:  none  Pain: Initial: visual scale  Pain Intensity 1: 3  Pain Location 1: Shoulder  Pain Orientation 1: Right  Pain Intervention(s) 1: Repositioned  Post Session:  3/10     Therapeutic Exercise: (12 Minutes):  Exercises per grid below to improve mobility and dynamic movement of arm - right to improve functional endurance.  Required minimal verbal cues to promote proper body alignment and promote proper body mechanics.  Progressed repetitions as indicated. Written guidelines provided.  Gait Training (11 Minutes):  Gait training to improve and/or restore physical functioning as related to mobility, strength, balance, coordination and dynamic movement of leg - bilateral to improve functional gait.  Ambulated 400 Feet (ft) with Supervision using a  (none) and minimal cues related to their stride length and heel strike to promote proper body alignment, promote proper body posture and promote proper body mechanics.      Date:  6/30 Date:   Date:     ACTIVITY/EXERCISE AM PM AM PM AM PM   Gripping 15 15       Wrist Flexion/Extension 15 15  Wrist Ulnar/Radial Deviation         Pronation/Supination 15 15        Elbow Flexion/Extension 15aa 15aa       Shoulder Flexion/Extension         Shoulder AB/ADduction         Shoulder IR/ER         Pulleys         Pendulums 15p clock & counter clockwise 15p clock & counter clockwise       Shrugs         Isometric:                 Flexion         Extension         ABduction         ADduction         Biceps/Triceps                  B = bilateral; AA = active assistive; A = active; P = passive  Education:  [x]  Home Exercises  [x]  Sling Application   [x]  Movement Precautions   []  Pulleys   []  Use of Ice   []  Other:   Treatment/Session Assessment:    ?? Response to Treatment:  tolerated very well this pm  ?? Interdisciplinary Collaboration:   o Registered Nurse  o Certified Nursing Assistant/Patient Care Technician  ?? After treatment position/precautions:   o Supine in bed  o Bed/Chair-wheels locked  o Bed in low position  o Call light within reach  o RN notified   ?? Compliance with Program/Exercises: Will assess as treatment progresses.  ?? Recommendations/Intent for next treatment session:  Treatment next visit will focus on increasing Mr. Hinostroza's independence with bed mobility, transfers, gait training, strength/ROM exercises, modalities for pain, and patient education.   Total Treatment Duration:  PT Patient Time In/Time Out  Time In: 1410  Time Out: 1433    Elissa Lovett, PT

## 2016-11-21 NOTE — Progress Notes (Signed)
Pt complaining of severe pain.  Taking Norco and dilaudid.  No anesthetic complications

## 2016-11-21 NOTE — Progress Notes (Signed)
Requested for Norco tablets,,given two tablets Norco (7.5 mgs.each) prn for pain,,IV access flushed on left arm with IV antibiotics infusing at this time (Vancomycin 1 gm.),,patient in bed resting quietly so far.Marland Kitchen.Marland Kitchen..Marland Kitchen

## 2016-11-21 NOTE — Progress Notes (Signed)
Care Management Interventions  Transition of Care Consult (CM Consult): Home Health  [facility]: Yes  Current Support Network: Relative's Home  Plan discussed with Pt/Family/Caregiver: Yes  Freedom of Choice Offered: Yes  Discharge Location  Discharge Placement: Home with home health

## 2016-11-21 NOTE — Progress Notes (Signed)
Orthopedic Joint Progress Note    November 21, 2016  Admit Date: 11/20/2016  Admit Diagnosis: Pain due to internal orthopedic prosthetic device, initial encounter 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    Objective:     PT/OT:     PATIENT MOBILITY                           Vital Signs:    Blood pressure 120/64, pulse 78, temperature 97.8 ??F (36.6 ??C), resp. rate 20, weight 88.1 kg (194 lb 3.2 oz), SpO2 94 %.  Temp (24hrs), Avg:97.9 ??F (36.6 ??C), Min:97.1 ??F (36.2 ??C), Max:98.5 ??F (36.9 ??C)      Pain Control:   Pain Assessment  Pain Scale 1: Numeric (0 - 10)  Pain Intensity 1: 10  Pain Location 1: Shoulder  Pain Orientation 1: Right  Pain Description 1: Throbbing  Pain Intervention(s) 1: Medication (see MAR)    Meds:  Current Facility-Administered Medications   Medication Dose Route Frequency   ??? 0.9% sodium chloride infusion  75 mL/hr IntraVENous CONTINUOUS   ??? sodium chloride (NS) flush 5-10 mL  5-10 mL IntraVENous Q8H   ??? sodium chloride (NS) flush 5-10 mL  5-10 mL IntraVENous PRN   ??? bisacodyl (DULCOLAX) suppository 10 mg  10 mg Rectal DAILY PRN   ??? sodium phosphate (FLEET'S) enema 1 Enema  1 Enema Rectal PRN   ??? promethazine (PHENERGAN) tablet 25 mg  25 mg Oral Q4H PRN   ??? docusate sodium (COLACE) capsule 100 mg  100 mg Oral BID   ??? ferrous sulfate tablet 325 mg  1 Tab Oral BID WITH MEALS   ??? temazepam (RESTORIL) capsule 15 mg  15 mg Oral QHS PRN   ??? HYDROmorphone (DILAUDID) tablet 4 mg  4 mg Oral Q3H PRN   ??? HYDROmorphone (DILAUDID) tablet 2 mg  2 mg Oral Q3H PRN   ??? tuberculin injection 5 Units  5 Units IntraDERMal ONCE   ??? HYDROmorphone (PF) (DILAUDID) injection 1 mg  1 mg IntraVENous Q1H PRN   ??? morphine CR (MS CONTIN) tablet 15 mg  15 mg Oral Q12H   ??? vancomycin (VANCOCIN) 1,000 mg in 0.9% sodium chloride (MBP/ADV) 250 mL   1 g IntraVENous Q12H   ??? acetaminophen (TYLENOL) tablet 1,000 mg  1,000 mg Oral Q6H PRN        LAB:    Lab Results   Component Value Date/Time    INR 0.9 11/13/2016 02:11 PM     Lab Results   Component Value Date/Time    HGB 12.0 (L) 11/21/2016 04:17 AM    HGB 15.6 11/13/2016 02:11 PM    HGB 17.6 (H) 10/09/2016 12:23 PM       Wound Shoulder Right (Active)   DRESSING STATUS Clean, dry, and intact 11/20/2016  7:28 PM   DRESSING TYPE 4 x 4;ABD pad;Adhesive wound closure strips (Steri-Strips);Topical skin adhesive/glue 11/20/2016  7:28 PM   SPLINT TYPE/MATERIAL Sling 11/20/2016  7:28 PM   Number of days:1             Physical Exam:  Neuro intact/ dressing intact    Assessment:      Principal Problem:    Instability of prosthetic shoulder joint (HCC) (11/14/2016)  Active Problems:    History of right shoulder replacement (11/14/2016)      Anemia associated with acute blood loss (11/20/2016)         Plan:     Continue PT/OT/Rehab  Consult: Rehab team including PT, OT, recreational therapy, and social services    Patient Expects to be Discharged to:: Other (comment)     Signed By: Trula OreStephen R Danel Studzinski, MD

## 2016-11-21 NOTE — Progress Notes (Signed)
Patient in bed ,awake,alert,complaining of post-op.pain,,bulky dressing intact,clean and dry with arm in sling..wiggles fingers well,strong radial pulses,,good cap.refills noted,,IV line patent with fluids still infusing  Given slow IV push Dilaudid (1mg .),prn for pain,,took all p.o. Medications well,,Dr.Ridgeway (rounding POA physician) in to see patient,, orders made.......Marland Kitchen

## 2016-11-21 NOTE — Progress Notes (Signed)
Sat monitor # 10 @ bedside.  Alarms set per protocol.

## 2016-11-21 NOTE — Op Note (Addendum)
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    PROCEDURE PERFORMED:  Incision, debridement, removal of implant, right shoulder "Zimmer TM press fit TSA" with a proximal humeral osteotomy and revision right total shoulder arthroplasty with a reverse long stem Delta Xtend prosthesis, Biceps tenodesis, latissimus dorsi and teres major tendon transfer.    SURGEON:  Randall Hiss.Florene Route., MD.    PATHOLOGY:  Periprosthetic instability with a deficient subscapularis.    CPT CODES: 74259, [id number] and 24400.       ICD-10 CODES:  T84.028, [id number].    IMPLANTS AND HARDWARE UTILIZED:  +10 metaglene, 42 eccentric glenosphere, 42+9 cu,  8/1 long stem, a 10 mm Biostop G, 42 mm superior, 36 mm inferior, 18 mm anterior nonlocking cortical screws.    ANESTHESIA:  General with interscalene block.    ESTIMATED BLOOD LOSS:  500 mL.    COMPLICATIONS:  None.     CERTIFIED FIRST ASSISTANT:  Caffie Pinto, MD  certified first assistant. Use of a certified first assistant was necessary to maximize the patient's safety and optimize outcome due to the complexity of the operative procedure.    SPECIMENS REMOVED:  Frozen section and cultures labeled 1, 2 and 3 from the right shoulder.    INDICATIONS:  The patient is a 56 year old gentleman who was initially treated elsewhere.  The patient underwent a right total shoulder arthroplasty with a Bigliani//Flatow Zimmer TM press fit components on both the glenoid and humeral side. His postoperative course was complicated by the subscapularis rupture.  The patient underwent a second procedure consisting with open repair of his subscapularis tear.  The  patient has developed a sore painful unstable right shoulder.  Preoperative workup including laboratory values which demonstrate a normal white count, normal ESR, normal CRP and aspirate of the right shoulder were negative for infection.  EMG and nerve conduction studies demonstrate no axillary nerve injury.  The patient has exhausted extensive nonoperative measures and now elects for operative intervention.    PROCEDURE IN DETAIL:  Following identification of the patient, the patient was taken to the operative suite.  Following induction of  general anesthesia, interscalene block, for postop pain control.  No antibiotics, placement of a Foley catheter.  The patient was very carefully positioned on the operating table in the Weeks chair fashion.  The right arm was then prepped and draped in sterile fashion.  His previous surgical incision was identified.  It was marked and  InteguSeal was applied.  Once InteguSeal was allowed to cure a portion of his incision was utilized.  The skin was incised, subcutaneous tissue was then dissected down to the cephalic vein in the deltopectoral interval.  This was very carefully dissected free.  Deltoid was retracted laterally.  Pectoralis major muscle was retracted medially.  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  The patient had a positive popeye sign.  The deltoid was then very carefully elevated off the underlying humeral shaft and the subdeltoid bursa was mobilized.  Axillary and radial nerves were protected.  The dissection was carried out into the glenohumeral joint.  An arthrotomy was then performed.  Cultures from the joint were obtained labeled 1, 2 and 3.  The patient then received 1 gram of IV vancomycin.  A frozen section was sent.  Frozen section was negative.  There was no evident infection.  Subscapularis was noted to be torn.  The humerus is then very carefully dislocated from the wound.  The humeral head was then removed using the  tuning fork.  It was noted at this point that the TM press fit stem was well fixed.  At this point, using a bur rongeur, osteotomes and an oscillating saw, a proximal humeral osteotomy was then performed for a length of roughly 5 cm.  The humeral shaft was then mobilized.  At this point, the extractor was then applied to the proximal humeral stem and the stem was removed in its entirety without incident.  At this point, the humeral canal was then vigorously debrided and irrigated.  It was noted that an 81 long stem gave excellent stability and was appropriate size.  The humeral canal was then vigorously debrided and irrigated with 6 liters of jet pulse lavage, previous suture anchor material and sutures were removed in their entirety.  At this point, our attention was then turned to the glenoid.  The glenoid was then meticulously exposed.  The TM component was visualized with the use of the saw the polyethylene was then cut in a T like fashion and removed.  The underlying trabecular metal was then visualized with the use of the bur osteotomes and a rongeur.  The TM glenoid component was removed in its entirety.  Again, there was no evidence of any purulence.  The glenoid was then meticulously exposed.  The glenoid was then sized.  The guidewire was then passed.  The glenoid was then drilled and reamed to a +10 length.  The +10 metaglene was then inserted and secured with a 42 mm superior, 36 mm inferior and an 18 mm anterior cortical screw.  The metaglene was stable. The 42 eccentric glenosphere was inserted and secured in the standard fashion.  Metaglene glenosphere stable.  Humerus was dislocated back from the wound.  The latissimus dorsi and teres major tendons were then identified.  They were identified. They were mobilized, tagged for later posterior transfer.  Axillary and radial nerves were protected.  The biceps tendon, which was noted to be chronically torn with a popeye sign was then mobilized and  brought out to length for later repair.  At this point the humeral canal was irrigated and dried.  A 10 mm Biostop G was then placed distally.  At this point, antibiotic impregnated cement was then mixed. The cement was inserted in the humeral canal and a 8/1 long stem whose fins were preloaded with #5 sutures were cemented in appropriate version.  Excess cement was removed with a curette.  Once the cement was allowed to cure,  a true 42+9 cup was inserted.  Shoulder was reduced.  There is excellent stability with excellent mobility. At   this point, latissimus dorsi, and teres major tendons were then transferred posteriorly and secured with #5 and #2 Mersilene sutures.  The deltopectoral interval was approximated the #5 Mersilene sutures.  The biceps tendon was brought out to length and tenodesed using #5 mersilene sutures.  At this point, the arm was put through range of motion and stable.  At this point, the wound was then irrigated, The skin was then closed with 0 Vicryl figure-of-eight sutures and 0 Prolene subcuticular stitch.  A sterile gauze, fluffs and swathe was applied.  The patient was then transferred  to the recovery in stable condition.      CPT Codes are 42683, [id number], 22979.     ICD-10 Codes, T84.028, [id number].     HARDWARE UTILIZED:  +10 metaglene, 42 eccentric glenosphere, 42+9 cup, 8/1 long stem, 10 mm Biostop G, 42 mm superior, 36 mm inferior 18 mm anterior cortical screw.     Cultures were sent from the right shoulder labeled 1, 2 and 3 and frozen section was sent which was negative.  Axillary and radial nerves were protected throughout the procedure.      Maryla Morrow, MD       AGP / VN  D: 11/20/2016 18:53     T: 11/21/2016 00:10  JOB #: [id number]  CC: Samuel Jester, MD

## 2016-11-21 NOTE — Progress Notes (Signed)
[facility]  Face to Face Encounter    Patient???s Name: Angel Weeks    Date of Birth: 1960/09/10    Ordering Physician: Lennart PallAlan Posta    Primary Diagnosis: TSA    Date of Face to Face:  11/20/2016    Face to Face Encounter findings are related to primary reason for home care:   yes.     1. I certify that the patient needs intermittent care as follows: physical therapy: strengthening    2. I certify that this patient is homebound, that is: 1) patient requires the use of a walker device, special transportation, or assistance of another to leave the home; or 2) patient's condition makes leaving the home medically contraindicated; and 3) patient has a normal inability to leave the home and leaving the home requires considerable and taxing effort.  Patient may leave the home for infrequent and short duration for medical reasons, and occasional absences for non-medical reasons. Homebound status is due to the following functional limitations: Patient with strength deficits limiting the performance of all ADL's without caregiver assistance or the use of an assistive device.    3. I certify that this patient is under my care and that I, or a nurse practitioner or physician???s assistant, or clinical nurse specialist, or certified nurse midwife, working with me, had a Face-to-Face Encounter that meets the physician Face-to-Face Encounter requirements.  The following are the clinical findings from the Face-to-Face encounter that support the need for skilled services and is a summary of the encounter: See hospital chart.        See attached progess note      Angel Weeks, LMSW  10/23/2015      THE FOLLOWING TO BE COMPLETED BY THE COMMUNITY PHYSICIAN:    I concur with the findings described above from the F2F encounter that this patient is homebound and in need of a skilled service.    Certifying Physician: _____________________________________       Printed Certifying Physician Name: _____________________________________    Date: _________________

## 2016-11-21 NOTE — Progress Notes (Signed)
Medicated with ativan 2 mg PO as requested by patient for sleep, see MAR.

## 2016-11-22 LAB — CBC W/O DIFF
HCT: 31.9 % — ABNORMAL LOW (ref 41.1–50.3)
HGB: 10.6 g/dL — ABNORMAL LOW (ref 13.6–17.2)
MCH: 29.4 PG (ref 26.1–32.9)
MCHC: 33.2 g/dL (ref 31.4–35.0)
MCV: 88.6 FL (ref 79.6–97.8)
MPV: 9.5 FL — ABNORMAL LOW (ref 10.8–14.1)
PLATELET: 238 10*3/uL (ref 150–450)
RBC: 3.6 M/uL — ABNORMAL LOW (ref 4.23–5.67)
RDW: 13.5 % (ref 11.9–14.6)
WBC: 8.9 10*3/uL (ref 4.3–11.1)

## 2016-11-22 LAB — METABOLIC PANEL, BASIC
Anion gap: 10 mmol/L (ref 7–16)
BUN: 6 MG/DL (ref 6–23)
CO2: 27 mmol/L (ref 21–32)
Calcium: 8.8 MG/DL (ref 8.3–10.4)
Chloride: 103 mmol/L (ref 98–107)
Creatinine: 0.87 MG/DL (ref 0.8–1.5)
GFR est AA: >60 mL/min/{1.73_m2} (ref >60)
GFR est non-AA: >60 mL/min/{1.73_m2} (ref >60)
Glucose: 111 mg/dL — ABNORMAL HIGH (ref 65–100)
Potassium: 4 mmol/L (ref 3.5–5.1)
Sodium: 140 mmol/L (ref 136–145)

## 2016-11-22 LAB — MAGNESIUM: Magnesium: 1.9 mg/dL (ref 1.8–2.4)

## 2016-11-22 MED FILL — MORPHINE ER 15 MG TAB: 15 mg | ORAL | Qty: 1

## 2016-11-22 MED FILL — DOCUSATE SODIUM 100 MG CAP: 100 mg | ORAL | Qty: 1

## 2016-11-22 MED FILL — VANCOMYCIN 1,000 MG IV SOLR: 1000 mg | INTRAVENOUS | Qty: 1000

## 2016-11-22 MED FILL — FERROUS SULFATE 325 MG (65 MG ELEMENTAL IRON) TAB: 325 mg (65 mg iron) | ORAL | Qty: 1

## 2016-11-22 MED FILL — LORAZEPAM 1 MG TAB: 1 mg | ORAL | Qty: 2

## 2016-11-22 MED FILL — HYDROCODONE-ACETAMINOPHEN 7.5 MG-325 MG TAB: ORAL | Qty: 2

## 2016-11-22 NOTE — Discharge Summary (Signed)
[place] 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    HOSPITAL COURSE:  Please see H and P, operative notes and consults for details.  The patient is a 56 year old gentleman who was admitted on 11/20/2016 who underwent uncomplicated I and D, removal of implant, right shoulder "Zimmer" TM press fit TSA with a proximal humeral osteotomy and revision right total shoulder arthroplasty with a reverse long stem Delta Xtend prosthesis, biceps tenodesis, latissimus dorsi and teres major tendon transfer.  On postoperative day #1, his cultures were no growth to date.  Hemoglobin was 12.0.  Potassium was 4.4, magnesium was 2.0.  He was maintained on vancomycin.  On postop day #2, hemoglobin was stable at 10.6.  Potassium and magnesium were within normal limits.  His initial aerobic cultures were negative.  He was discharged home on postoperative day #2.  He was discharged home on doxycycline.  He will continue with therapy on the outside and follow up in the office in 10 days.      DISPOSITION:  HOME.      Orvis BrillALAN G. POSTA, JR, MD       AGP/DN  D: 11/23/2016 16:41     T: 11/23/2016 21:35  JOB #: [id number]  CC: Zella RicherALAN POSTA Jr, MD

## 2016-11-22 NOTE — Progress Notes (Signed)
Medicated with norco 2 tablets PO for c/o pain, see MAR.

## 2016-11-22 NOTE — Progress Notes (Signed)
Instructed pt to call when his ride arrived. Pt left without notice.

## 2016-11-22 NOTE — Progress Notes (Signed)
Problem: Mobility Impaired (Adult and Pediatric)  Goal: *Acute Goals and Plan of Care (Insert Text)  GOALS (1-4 days):  (1.)  Patient will move from supine to sit and sit to supine  in bed with MODIFIED INDEPENDENCE.  Met 6/30  (2.)  Patient will transfer from bed to chair and chair to bed with SUPERVISION using the least restrictive device. Met 6/30   (3.)  Patient will ambulate with SUPERVISION for 200 feet with the least restrictive device. Met 6/30  (4.)  Patient will be safe, with caregiver's help,with shoulder HEP to increase range of motion per MD orders. (re-instructed 7/1)  ________________________________________________________________________________________________    PHYSICAL THERAPY: Daily Note, Treatment Day: 1st, AM 11/22/2016  INPATIENT: Hospital Day: 3  Payor: ABSOLUTE TOTAL CARE / [place] / Product Type: Managed Care Medicaid /      NAME/AGE/GENDER: Angel Weeks is a 56 y.o. 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  REVISION RIGHT TOTAL SHOULDER ARTHROPLASTY WITH REVERSE LONG STEM DELTA EXTEND PROSTHESIS, BICEPS TENODESIS, LATISSIMUS DORSI AND TERES MAJOR TENDON TRANSFERS (Right)  2 Days Post-Op  ICD-10: Treatment Diagnosis:   ?? Pain in Right Shoulder (M25.511)  ?? Stiffness of Right Shoulder, Not elsewhere classified (M25.611)  ?? Generalized Muscle Weakness (M62.81)  ?? Difficulty in walking, Not elsewhere classified (R26.2)   Precaution/Allergies:  Review of patient's allergies indicates no known allergies.      ASSESSMENT:      Angel Weeks was packing clothes & using right UE in sling when PT arrived. PT strongly emphasized that pt is not to actively use his right UE for functional tasks & to not fire muscles of right shoulder as observed. PT reviewed precautions & HEP with highlighting that gentle pendulums are passive, with assist of another to make gentle small circles, with written guidelines provided. PT re-applied swath on top of sling to add a further reminder that pt can not use right UE.    This section established at most recent assessment   PROBLEM LIST (Impairments causing functional limitations):  1. Decreased Independence with Bed Mobility  2. Decreased Independence with Transfers  3. Decreased Independence with Ambulation   4. Decreased Independence with shoulder HEP   INTERVENTIONS PLANNED: (Benefits and precautions of physical therapy have been discussed with the patient.)  1. Bed Mobility Training  2. Transfer Training  3. Gait Training  4. Therapeutic Exercises per MD orders  5. Modalities for Pain     TREATMENT PLAN: Frequency/Duration: twice daily for duration of hospital stay  Rehabilitation Potential For Stated Goals: Good     RECOMMENDED REHABILITATION/EQUIPMENT: (at time of discharge pending progress): Continue Skilled Therapy and Home Health: Physical Therapy.              HISTORY:   History of Present Injury/Illness (Reason for  Referral):  Painful right shoulder / s/p revision of right reverse TSA  Past Medical History/Comorbidities:   Angel Weeks  has a past medical history of Anxiety; Arthritis; Chronic daily headache; Chronic pain; Current smoker; Depression; GERD (gastroesophageal reflux disease); Meningioma ([place]); Pulmonary nodules; Seizures ([place]) (2010); Sleep apnea; and VP (ventriculoperitoneal) shunt status (2016). He also has no past medical history of Adverse effect of anesthesia; Aneurysm ([place]); Arrhythmia; Asthma; Autoimmune disease ([place]);  CAD (coronary artery disease); Cancer ([place]); Chronic kidney disease; Chronic obstructive pulmonary disease ([place]); Coagulation disorder ([place]); Diabetes ([place]); Difficult intubation; Endocarditis; Heart failure ([place]); Hypertension; Ill-defined condition; Liver disease; Malignant hyperthermia due to anesthesia; Morbid obesity ([place]); Nausea & vomiting; Nicotine vapor product user; Non-nicotine vapor product user; Pseudocholinesterase deficiency; PUD (peptic ulcer disease); Rheumatic fever; Stroke [facility]); Thromboembolus ([place]); or Thyroid disease.  Angel Weeks  has a past surgical history that includes pr neurological procedure unlisted (Right, 2015); hx other surgical (Left, 2016); hx shoulder replacement (Right, 10/2014); and hx shoulder arthroscopy (Right, 10/2015).  Social History/Living Environment:   Home Environment: Private residence  # Steps to Enter: 5  Rails to Enter: Yes  Science writer : Right  One/Two Story Residence: One story  Living Alone: No  Support Systems: Parent  Patient Expects to be Discharged to:: Private residence  Current DME Used/Available at Home: None  Prior Level of Function/Work/Activity:  Pt was independent without an assistive device prior to this admission   Number of Personal Factors/Comorbidities that affect the Plan of Care: 3+: HIGH COMPLEXITY   EXAMINATION:   Most Recent Physical Functioning:   Gross Assessment:  AROM: Within functional limits (left UE & both LE's)  Strength: Within functional limits (left UE & both LE's)  Coordination: Within functional limits (left UE & both LE's)                    Balance:  Sitting: Intact;Without support  Standing: Intact;Without support Bed Mobility:  Supine to Sit:  (NT)  Sit to Supine:  (NT)  Scooting: Independent       Transfers:  Sit to Stand: Supervision  Stand to Sit: Supervision  Bed to Chair: Supervision  Gait:     Speed/Cadence: Fluctuations  Step Length:  (equal)  Gait Abnormalities: Decreased step clearance   Distance (ft):  (in room ambulation 10-15 ft)  Assistive Device:  (none)  Ambulation - Level of Assistance: Supervision   Functional Mobility:         Gait/Ambulation:  sup        Transfers:  sup        Bed Mobility:  NT   Body Structures Involved:  1. Joints  2. Muscles Body Functions Affected:  1. Sensory/Pain  2. Movement Related Activities and Participation Affected:  1. General Tasks and Demands  2. Mobility   Number of elements that affect the Plan of Care: 4+: HIGH COMPLEXITY   CLINICAL PRESENTATION:   Presentation: Stable and uncomplicated: LOW COMPLEXITY   CLINICAL DECISION MAKING:   KB Home	[place] AM-PAC??? ???6 Clicks???   Basic Mobility Inpatient Short Form  How much difficulty does the patient currently have... Unable A Lot A Little None   1.  Turning over in bed (including adjusting bedclothes, sheets and blankets)?   []  1   []  2   [x]  3   []  4   2.  Sitting down on and standing up from a chair with arms ( e.g., wheelchair, bedside commode, etc.)   []  1   []   2   [x]  3   []  4   3.  Moving from lying on back to sitting on the side of the bed?   []  1   []  2   [x]  3   []  4   How much help from another person does the patient currently need... Total A Lot A Little None   4.  Moving to and from a bed to a chair (including a wheelchair)?   []  1   []  2   [x]  3   []  4   5.  Need to walk in hospital room?   []  1   []  2   [x]  3   []  4   6.  Climbing 3-5 steps with a railing?   [x]  1   []  2   []  3   []  4   ?? 2007, Trustees of [employer], under license to [employer]. All rights reserved      Score:  Initial: 16 Most Recent: X (Date: -- )    Interpretation of Tool:  Represents activities that are increasingly more difficult (i.e. Bed mobility, Transfers, Gait).   Score 24 23 22-20 19-15 14-10 9-7 6     Modifier CH CI CJ CK CL CM CN      ? Mobility - Walking and Moving Around:    P8242 - CURRENT STATUS: CK - 40%-59% impaired, limited or restricted    G8979 - GOAL STATUS: CJ - 20%-39% impaired, limited or restricted   P5361 - D/C STATUS:  ---------------To be determined---------------  Payor: ABSOLUTE TOTAL CARE / [place] / Product Type: Managed Care Medicaid /      Medical Necessity:     ?? Patient is expected to demonstrate progress in strength, range of motion, balance, coordination and functional technique to decrease assistance required with b ed mobility, transfers & gait.  Reason for Services/Other Comments:  ?? Patient continues to require skilled intervention due to pt not safe with functional mobility & HEP.   Use of outcome tool(s) and clinical judgement create a POC that gives a: Clear prediction of patient's progress: LOW COMPLEXITY            TREATMENT:   (In addition to Assessment/Re-Assessment sessions the following treatments were rendered)   Pre-treatment Symptoms/Complaints:  Pt pleased with pain control level  Pain: Initial: visual scale  Pain Intensity 1: 2  Pain Location 1: Shoulder  Pain Orientation 1: Right  Pain Intervention(s) 1: Repositioned  Post Session:  2/10     Therapeutic Exercise: (15 Minutes):  Exercises per grid below to improve mobility and dynamic movement of arm - right to improve functional endurance.  Required minimal verbal cues to promote proper body alignment and promote proper body mechanics.  Progressed repetitions as indicated. Written guidelines provided.  .      Date:  6/30 Date:  7/1 Date:     ACTIVITY/EXERCISE AM PM AM PM AM PM   Gripping 15 15 25       Wrist Flexion/Extension 15 15 25       Wrist Ulnar/Radial Deviation         Pronation/Supination 15 15 25       Elbow Flexion/Extension 15aa 15aa 25aa      Shoulder Flexion/Extension         Shoulder AB/ADduction         Shoulder IR/ER         Pulleys         Pendulums  15p clock & counter clockwise 15p clock & counter clockwise 25p clock & counter clockwise      Shrugs         Isometric:                 Flexion         Extension          ABduction         ADduction         Biceps/Triceps                  B = bilateral; AA = active assistive; A = active; P = passive  Education:  [x]   Home Exercises  [x]   Sling Application   [x]   Movement Precautions   []   Pulleys   []   Use of Ice   []   Other:   Treatment/Session Assessment:    ?? Response to Treatment:  Pt seemed to be clear on precautions reviewed  ?? Interdisciplinary Collaboration:   o Registered Nurse  ?? After treatment position/precautions:   o Up in chair  o Bed/Chair-wheels locked  o Call light within reach  o RN notified   ?? Compliance with Program/Exercises: Will assess as treatment progresses.  ?? Recommendations/Intent for next treatment session:  Treatment next visit will focus on increasing Angel Weeks's independence with bed mobility, transfers, gait training, strength/ROM exercises, modalities for pain, and patient education.   Total Treatment Duration:  PT Patient Time In/Time Out  Time In: 0745  Time Out: 0800    Elissa Lovett, PT

## 2016-11-22 NOTE — Progress Notes (Signed)
Discharge instructions given to pt. Voiced understanding. No questions or concerns at this time

## 2016-11-22 NOTE — Progress Notes (Signed)
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    Objective:     PT/OT:     PATIENT MOBILITY    Bed Mobility  Supine to Sit: Modified independent  Sit to Supine: Modified independent  Scooting: Independent  Transfers  Sit to Stand: Supervision  Stand to Sit: Supervision  Bed to Chair: Supervision      Gait  Speed/Cadence: Fluctuations  Step Length:  (equal)  Gait Abnormalities: Decreased step clearance  Ambulation - Level of Assistance: Supervision  Distance (ft): 400 Feet (ft)  Assistive Device:  (none)  Rail Use: Left   Stairs - Level of Assistance: Supervision  Number of Stairs Trained: 12  Duration: 11 Minutes            Vital Signs:    Blood pressure 145/85, pulse 77, temperature 98.6 ??F (37 ??C), resp. rate 18, weight 88.1 kg (194 lb 3.2 oz), SpO2 94 %.  Temp (24hrs), Avg:98.2 ??F (36.8 ??C), Min:97.7 ??F (36.5 ??C), Max:98.6 ??F (37 ??C)      Pain Control:   Pain Assessment  Pain Scale 1: Visual  Pain Intensity 1: 0  Pain Location 1: Shoulder  Pain Orientation 1: Right  Pain Description 1: Aching  Pain Intervention(s) 1: Medication (see MAR)    Meds:  Current Facility-Administered Medications   Medication Dose Route Frequency   ??? HYDROcodone-acetaminophen (NORCO) 7.5-325 mg per tablet 2 Tab  2 Tab Oral Q6H PRN   ??? LORazepam (ATIVAN) tablet 2 mg  2 mg Oral TID PRN   ??? sodium chloride (NS) flush 5-10 mL  5-10 mL IntraVENous Q8H   ??? sodium chloride (NS) flush 5-10 mL  5-10 mL IntraVENous PRN   ??? bisacodyl (DULCOLAX) suppository 10 mg  10 mg Rectal DAILY PRN   ??? sodium phosphate (FLEET'S) enema 1 Enema  1 Enema Rectal PRN   ??? promethazine (PHENERGAN) tablet 25 mg  25 mg Oral Q4H PRN    ??? docusate sodium (COLACE) capsule 100 mg  100 mg Oral BID   ??? ferrous sulfate tablet 325 mg  1 Tab Oral BID WITH MEALS   ??? temazepam (RESTORIL) capsule 15 mg  15 mg Oral QHS PRN   ??? tuberculin injection 5 Units  5 Units IntraDERMal ONCE   ??? HYDROmorphone (PF) (DILAUDID) injection 1 mg  1 mg IntraVENous Q1H PRN   ??? morphine CR (MS CONTIN) tablet 15 mg  15 mg Oral Q12H   ??? vancomycin (VANCOCIN) 1,000 mg in 0.9% sodium chloride (MBP/ADV) 250 mL  1 g IntraVENous Q12H        LAB:    Lab Results   Component Value Date/Time    INR 0.9 11/13/2016 02:11 PM     Lab Results   Component Value Date/Time    HGB 10.6 (L) 11/22/2016 04:13 AM    HGB 12.0 (L) 11/21/2016 04:17 AM    HGB 15.6 11/13/2016 02:11 PM       Wound Shoulder Right (Active)   DRESSING STATUS Clean, dry, and intact 11/21/2016  8:00 PM   DRESSING TYPE 4 x 4;Transparent film 11/21/2016  8:00 PM   SPLINT TYPE/MATERIAL Sling 11/21/2016  8:00 PM   Drainage Amount  None 11/21/2016  8:00 PM   Dressing Type Applied 4 x 4;Transparent film;Dry dressing 11/21/2016 10:30 AM   Procedure Tolerated Fair 11/21/2016 10:30 AM   Number of days:2             Physical Exam:  No significant changes    Assessment:      Principal Problem:    Instability of prosthetic shoulder joint (HCC) (11/14/2016)    Active Problems:    History of right shoulder replacement (11/14/2016)      Anemia associated with acute blood loss (11/20/2016)         Plan:     Continue PT/OT/Rehab  Consult: Rehab team including PT, OT, recreational therapy, and social services   Home today    Patient Expects to be Discharged to:: Private residence     Signed By: Trula Ore, MD

## 2016-11-22 NOTE — Discharge Summary (Signed)
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    HOSPITAL COURSE:  Please see H and P, operative notes and consults for details.  The patient is a 56-year-old gentleman who was admitted on 11/20/2016 who underwent uncomplicated I and D, removal of implant, right shoulder "Zimmer" TM press fit TSA with a proximal humeral osteotomy and revision right total shoulder arthroplasty with a reverse long stem Delta Xtend prosthesis, biceps tenodesis, latissimus dorsi and teres major tendon transfer.  On postoperative day #1, his cultures were no growth to date.  Hemoglobin was 12.0.  Potassium was 4.4, magnesium was 2.0.  He was maintained on vancomycin.  On postop day #2, hemoglobin was stable at 10.6.  Potassium and magnesium were within normal limits.  His initial aerobic cultures were negative.  He was discharged home on postoperative day #2.  He was discharged home on doxycycline.  He will continue with therapy on the outside and follow up in the office in 10 days.      DISPOSITION:  HOME.      Addisyn Leclaire G. POSTA, JR, MD       AGP/DN  D: 11/23/2016 16:41     T: 11/23/2016 21:35  JOB #: [id number]  CC: Darla Mcdonald POSTA Jr, MD

## 2016-11-22 NOTE — Progress Notes (Signed)
Shift Assessment Complete. Pt is post op day 2 from RTSA and I&D. Pt is A&Ox 3.  +2 radial pulses with purposeful movement in all four extremities. Dressing is clean, dry and intact. PIV capped.   Pt denies any pain or need at this time.  Bed low and locked. Side rails x3.  Call light with in reach.  Pt verbalizes understanding of call light.

## 2016-11-23 ENCOUNTER — Encounter: Admit: 2016-11-23 | Discharge: 2016-11-23 | Payer: PRIVATE HEALTH INSURANCE | Primary: Internal Medicine

## 2016-11-23 LAB — TYPE + CROSSMATCH
ABO/Rh(D): A NEG
Antibody screen: NEGATIVE
Unit division: 0
Unit division: 0

## 2016-11-23 LAB — CULTURE, WOUND W GRAM STAIN
Culture result:: NO GROWTH
Culture result:: NO GROWTH
Culture result:: NO GROWTH
GRAM STAIN: NONE SEEN
GRAM STAIN: NONE SEEN
GRAM STAIN: NONE SEEN
GRAM STAIN: NONE SEEN
GRAM STAIN: NONE SEEN
GRAM STAIN: NONE SEEN

## 2016-11-23 MED FILL — NEOSTIGMINE METHYLSULFATE 3 MG/3 ML (1 MG/ML) IV SYRINGE: 3 mg/ mL (1 mg/mL) | INTRAVENOUS | Qty: 3

## 2016-11-23 MED FILL — DEXAMETHASONE SODIUM PHOSPHATE 4 MG/ML IJ SOLN: 4 mg/mL | INTRAMUSCULAR | Qty: 4

## 2016-11-23 MED FILL — EPHEDRINE SULFATE 50 MG/ML IJ SOLN: 50 mg/mL | INTRAMUSCULAR | Qty: 40

## 2016-11-23 MED FILL — ROCURONIUM 10 MG/ML IV: 10 mg/mL | INTRAVENOUS | Qty: 75

## 2016-11-23 MED FILL — ONDANSETRON (PF) 4 MG/2 ML INJECTION: 4 mg/2 mL | INTRAMUSCULAR | Qty: 4

## 2016-11-23 MED FILL — PHENYLEPHRINE 10 MG/ML INJECTION: 10 mg/mL | INTRAMUSCULAR | Qty: 450

## 2016-11-23 MED FILL — EPINEPHRINE (PF) 1 MG/ML INJECTION: 1 mg/mL ( mL) | INTRAMUSCULAR | Qty: 0.1

## 2016-11-23 MED FILL — PROPOFOL 10 MG/ML IV EMUL: 10 mg/mL | INTRAVENOUS | Qty: 200

## 2016-11-23 MED FILL — XYLOCAINE-MPF 20 MG/ML (2 %) INJECTION SOLUTION: 20 mg/mL (2 %) | INTRAMUSCULAR | Qty: 60

## 2016-11-23 MED FILL — GLYCOPYRROLATE 0.2 MG/ML IJ SOLN: 0.2 mg/mL | INTRAMUSCULAR | Qty: 0.6

## 2016-11-23 MED FILL — NAROPIN (PF) 5 MG/ML (0.5 %) INJECTION SOLUTION: 5 mg/mL (0. %) | INTRAMUSCULAR | Qty: 20

## 2016-11-25 ENCOUNTER — Encounter: Admit: 2016-11-25 | Discharge: 2016-11-25 | Payer: PRIVATE HEALTH INSURANCE | Primary: Internal Medicine

## 2016-11-26 ENCOUNTER — Encounter: Primary: Internal Medicine

## 2016-11-26 ENCOUNTER — Encounter: Admit: 2016-11-26 | Discharge: 2016-11-26 | Payer: PRIVATE HEALTH INSURANCE | Primary: Internal Medicine

## 2016-11-27 ENCOUNTER — Encounter: Admit: 2016-11-27 | Discharge: 2016-11-27 | Payer: PRIVATE HEALTH INSURANCE | Primary: Internal Medicine

## 2016-11-30 ENCOUNTER — Encounter: Admit: 2016-11-30 | Discharge: 2016-11-30 | Payer: PRIVATE HEALTH INSURANCE | Primary: Internal Medicine

## 2016-12-04 ENCOUNTER — Encounter: Admit: 2016-12-04 | Discharge: 2016-12-04 | Payer: PRIVATE HEALTH INSURANCE | Primary: Internal Medicine

## 2016-12-14 LAB — CULTURE, ANAEROBIC
Culture result:: NO GROWTH
Culture result:: NO GROWTH
Culture result:: NO GROWTH

## 2019-02-23 DEATH — deceased

## 2020-12-16 ENCOUNTER — Encounter: Payer: Self-pay | Admitting: Gastroenterology

## 2021-11-05 ENCOUNTER — Encounter: Payer: Self-pay | Admitting: Gastroenterology

## 2021-11-26 ENCOUNTER — Ambulatory Visit (AMBULATORY_SURGERY_CENTER): Payer: Self-pay | Admitting: *Deleted

## 2021-11-26 VITALS — Ht 72.0 in | Wt 205.0 lb

## 2021-11-26 DIAGNOSIS — Z1211 Encounter for screening for malignant neoplasm of colon: Secondary | ICD-10-CM

## 2021-11-26 MED ORDER — NA SULFATE-K SULFATE-MG SULF 17.5-3.13-1.6 GM/177ML PO SOLN: 1.0000 | ORAL | 0 refills | Status: DC

## 2021-11-26 NOTE — Progress Notes (Signed)
Patient is here in-person for PV. Patient denies any allergies to eggs or soy. Patient denies any problems with anesthesia/sedation. Patient is not on any oxygen at home. Patient is not taking any diet/weight loss medications or blood thinners. Went over procedure prep instructions with the patient. Patient is aware of our care-partner policy. Patient notified to use Good-Rx for prescription.   EMMI education assigned to the patient for the procedure, sent to [place].

## 2021-12-05 ENCOUNTER — Encounter: Payer: Self-pay | Admitting: Gastroenterology

## 2021-12-14 ENCOUNTER — Encounter: Payer: Self-pay | Admitting: Certified Registered Nurse Anesthetist

## 2021-12-17 ENCOUNTER — Encounter: Payer: Self-pay | Admitting: Gastroenterology

## 2021-12-17 ENCOUNTER — Ambulatory Visit (AMBULATORY_SURGERY_CENTER): Payer: 59 | Admitting: Gastroenterology

## 2021-12-17 VITALS — BP 113/83 | HR 61 | Temp 98.0°F | Resp 19 | Ht 72.0 in | Wt 205.0 lb

## 2021-12-17 DIAGNOSIS — D124 Benign neoplasm of descending colon: Secondary | ICD-10-CM | POA: Diagnosis not present

## 2021-12-17 DIAGNOSIS — D122 Benign neoplasm of ascending colon: Secondary | ICD-10-CM | POA: Diagnosis not present

## 2021-12-17 DIAGNOSIS — D123 Benign neoplasm of transverse colon: Secondary | ICD-10-CM

## 2021-12-17 DIAGNOSIS — Z1211 Encounter for screening for malignant neoplasm of colon: Secondary | ICD-10-CM

## 2021-12-17 DIAGNOSIS — D125 Benign neoplasm of sigmoid colon: Secondary | ICD-10-CM | POA: Diagnosis not present

## 2021-12-17 MED ORDER — SODIUM CHLORIDE 0.9 % IV SOLN: 500.0000 mL | Freq: Once | INTRAVENOUS | Status: DC

## 2021-12-17 NOTE — Progress Notes (Signed)
Report given to PACU, vss 

## 2021-12-17 NOTE — Progress Notes (Signed)
History and Physical:  This patient presents for endoscopic testing for: Encounter Diagnosis  Name Primary?   Special screening for malignant neoplasms, colon Yes    No polyps last colonoscopy Jan 2012 Patient denies chronic abdominal pain, rectal bleeding, constipation or diarrhea.   Patient is otherwise without complaints or active issues today.   Past Medical History: Past Medical History:  Diagnosis Date   Allergy    Dermatofibroma    Hyperlipidemia      Past Surgical History: Past Surgical History:  Procedure Laterality Date   COLONOSCOPY  06/16/2010   Dr.Kaplan   rt acl repair      Allergies: No Known Allergies  Outpatient Meds: Current Outpatient Medications  Medication Sig Dispense Refill   rosuvastatin (CRESTOR) 10 MG tablet Take 10 mg by mouth daily.     sildenafil (VIAGRA) 100 MG tablet Take 100 mg by mouth daily as needed.     Current Facility-Administered Medications  Medication Dose Route Frequency Provider Last Rate Last Admin   0.9 %  sodium chloride infusion  500 mL Intravenous Once Doran Stabler, MD          ___________________________________________________________________ Objective   Exam:  BP (!) 142/82   Pulse 62   Temp 98 F (36.7 C) (Temporal)   Resp 14   Ht 6' (1.829 m)   Wt 205 lb (93 kg)   SpO2 100%   BMI 27.80 kg/m   CV: RRR without murmur, S1/S2 Resp: clear to auscultation bilaterally, normal RR and effort noted GI: soft, no tenderness, with active bowel sounds.   Assessment: Encounter Diagnosis  Name Primary?   Special screening for malignant neoplasms, colon Yes     Plan: Colonoscopy  The benefits and risks of the planned procedure were described in detail with the patient or (when appropriate) their health care proxy.  Risks were outlined as including, but not limited to, bleeding, infection, perforation, adverse medication reaction leading to cardiac or pulmonary decompensation, pancreatitis (if ERCP).   The limitation of incomplete mucosal visualization was also discussed.  No guarantees or warranties were given.    The patient is appropriate for an endoscopic procedure in the ambulatory setting.   - Wilfrid Lund, MD

## 2021-12-17 NOTE — Op Note (Signed)
[name]: Anthony Black Procedure Date: 12/17/2021 9:21 AM MRN: [id number] Endoscopist: Mallie Mussel L. Anthony Black , MD Age: 61 Referring MD:  Date of Birth: [birth year] Gender: Male Account #: [id number] Procedure:                Colonoscopy Indications:              Screening for colorectal malignant neoplasm                           no polyps last colonoscopy Jan 2012 Medicines:                Monitored Anesthesia Care Procedure:                Pre-Anesthesia Assessment:                           - Prior to the procedure, a History and Physical                            was performed, and patient medications and                            allergies were reviewed. The patient's tolerance of                            previous anesthesia was also reviewed. The risks                            and benefits of the procedure and the sedation                            options and risks were discussed with the patient.                            All questions were answered, and informed consent                            was obtained. Prior Anticoagulants: The patient has                            taken no previous anticoagulant or antiplatelet                            agents. ASA Grade Assessment: II - A patient with                            mild systemic disease. After reviewing the risks                            and benefits, the patient was deemed in                            satisfactory condition to undergo the procedure.  After obtaining informed consent, the colonoscope                            was passed under direct vision. Throughout the                            procedure, the patient's blood pressure, pulse, and                            oxygen saturations were monitored continuously. The                            CF [id number] #[id number] was introduced through the anus                            and advanced to the the cecum,  identified by                            appendiceal orifice and ileocecal valve. The                            colonoscopy was performed without difficulty. The                            patient tolerated the procedure well. The quality                            of the bowel preparation was good. The ileocecal                            valve, appendiceal orifice, and rectum were                            photographed. The bowel preparation used was Plenvu. Scope In: 9:25:51 AM Scope Out: 9:47:06 AM Scope Withdrawal Time: 0 hours 18 minutes 42 seconds  Total Procedure Duration: 0 hours 21 minutes 15 seconds  Findings:                 The perianal and digital rectal examinations were                            normal.                           Two sessile polyps were found in the transverse                            colon and ascending colon. The polyps were                            diminutive in size. These polyps were removed with                            a cold snare. Resection and retrieval were complete.  Three sessile polyps were found in the sigmoid                            colon, descending colon and transverse colon. The                            polyps were diminutive in size. These polyps were                            removed with a cold snare. Resection and retrieval                            were complete.                           Repeat examination of right colon under NBI                            performed.                           Internal hemorrhoids were found.                           The exam was otherwise without abnormality on                            direct and retroflexion views. Complications:            No immediate complications. Estimated Blood Loss:     Estimated blood loss was minimal. Impression:               - Two diminutive polyps in the transverse colon and                            in the ascending  colon, removed with a cold snare.                            Resected and retrieved.                           - Three diminutive polyps in the sigmoid colon, in                            the descending colon and in the transverse colon,                            removed with a cold snare. Resected and retrieved.                           - Internal hemorrhoids.                           - The examination was otherwise normal on direct  and retroflexion views. Recommendation:           - Patient has a contact number available for                            emergencies. The signs and symptoms of potential                            delayed complications were discussed with the                            patient. Return to normal activities tomorrow.                            Written discharge instructions were provided to the                            patient.                           - Resume previous diet.                           - Continue present medications.                           - Await pathology results.                           - Repeat colonoscopy is recommended for                            surveillance. The colonoscopy date will be                            determined after pathology results from today's                            exam become available for review. Larue Lightner L. Anthony Carrow, MD 12/17/2021 9:51:36 AM This report has been signed electronically.

## 2021-12-17 NOTE — Patient Instructions (Signed)
Please read handouts provided. Continue present medications. Await pathology results.   YOU HAD AN ENDOSCOPIC PROCEDURE TODAY AT THE [facility]:   Refer to the procedure report that was given to you for any specific questions about what was found during the examination.  If the procedure report does not answer your questions, please call your gastroenterologist to clarify.  If you requested that your care partner not be given the details of your procedure findings, then the procedure report has been included in a sealed envelope for you to review at your convenience later.  YOU SHOULD EXPECT: Some feelings of bloating in the abdomen. Passage of more gas than usual.  Walking can help get rid of the air that was put into your GI tract during the procedure and reduce the bloating. If you had a lower endoscopy (such as a colonoscopy or flexible sigmoidoscopy) you may notice spotting of blood in your stool or on the toilet paper. If you underwent a bowel prep for your procedure, you may not have a normal bowel movement for a few days.  Please Note:  You might notice some irritation and congestion in your nose or some drainage.  This is from the oxygen used during your procedure.  There is no need for concern and it should clear up in a day or so.  SYMPTOMS TO REPORT IMMEDIATELY:  Following lower endoscopy (colonoscopy or flexible sigmoidoscopy):  Excessive amounts of blood in the stool  Significant tenderness or worsening of abdominal pains  Swelling of the abdomen that is new, acute  Fever of 100F or higher  For urgent or emergent issues, a gastroenterologist can be reached at any hour by calling [phone]. Do not use MyChart messaging for urgent concerns.    DIET:  We do recommend a small meal at first, but then you may proceed to your regular diet.  Drink plenty of fluids but you should avoid alcoholic beverages for 24 hours.  ACTIVITY:  You should plan to take it easy for  the rest of today and you should NOT DRIVE or use heavy machinery until tomorrow (because of the sedation medicines used during the test).    FOLLOW UP: Our staff will call the number listed on your records the next business day following your procedure.  We will call around 7:15- 8:00 am to check on you and address any questions or concerns that you may have regarding the information given to you following your procedure. If we do not reach you, we will leave a message.  If you develop any symptoms (ie: fever, flu-like symptoms, shortness of breath, cough etc.) before then, please call [phone].  If you test positive for Covid 19 in the 2 weeks post procedure, please call and report this information to us.    If any biopsies were taken you will be contacted by phone or by letter within the next 1-3 weeks.  Please call us at [phone] if you have not heard about the biopsies in 3 weeks.    SIGNATURES/CONFIDENTIALITY: You and/or your care partner have signed paperwork which will be entered into your electronic medical record.  These signatures attest to the fact that that the information above on your After Visit Summary has been reviewed and is understood.  Full responsibility of the confidentiality of this discharge information lies with you and/or your care-partner.  

## 2021-12-18 ENCOUNTER — Telehealth: Payer: Self-pay | Admitting: *Deleted

## 2021-12-18 NOTE — Telephone Encounter (Signed)
  Follow up Call-     12/17/2021    8:37 AM  Call back number  Post procedure Call Back phone  # [phone]  Permission to leave phone message Yes     Patient questions:  Do you have a fever, pain , or abdominal swelling? No. Pain Score  0 *  Have you tolerated food without any problems? Yes.    Have you been able to return to your normal activities? Yes.    Do you have any questions about your discharge instructions: Diet   No. Medications  No. Follow up visit  No.  Do you have questions or concerns about your Care? No.  Actions: * If pain score is 4 or above: No action needed, pain <4.

## 2021-12-22 ENCOUNTER — Encounter: Payer: Self-pay | Admitting: Gastroenterology
